# Patient Record
Sex: Female | Born: 1940 | Race: Black or African American | Hispanic: No | Marital: Single | Smoking: Former smoker
Health system: Southern US, Community
[De-identification: ages and names within clinical notes are randomized; demographics above are authoritative.]

## PROBLEM LIST (undated history)

## (undated) ENCOUNTER — Emergency Department (HOSPITAL_COMMUNITY): Admission: EM | Payer: Medicare Other | Source: Home / Self Care

## (undated) DIAGNOSIS — E039 Hypothyroidism, unspecified: Secondary | ICD-10-CM

## (undated) DIAGNOSIS — IMO0002 Reserved for concepts with insufficient information to code with codable children: Secondary | ICD-10-CM

## (undated) DIAGNOSIS — I10 Essential (primary) hypertension: Secondary | ICD-10-CM

## (undated) DIAGNOSIS — K219 Gastro-esophageal reflux disease without esophagitis: Secondary | ICD-10-CM

## (undated) DIAGNOSIS — M1990 Unspecified osteoarthritis, unspecified site: Secondary | ICD-10-CM

## (undated) DIAGNOSIS — E7800 Pure hypercholesterolemia, unspecified: Secondary | ICD-10-CM

## (undated) DIAGNOSIS — I2510 Atherosclerotic heart disease of native coronary artery without angina pectoris: Secondary | ICD-10-CM

## (undated) HISTORY — PX: OTHER SURGICAL HISTORY: SHX169

## (undated) HISTORY — PX: TONSILLECTOMY: SUR1361

## (undated) HISTORY — DX: Gastro-esophageal reflux disease without esophagitis: K21.9

## (undated) HISTORY — PX: EYE SURGERY: SHX253

## (undated) HISTORY — DX: Unspecified osteoarthritis, unspecified site: M19.90

## (undated) HISTORY — DX: Essential (primary) hypertension: I10

## (undated) HISTORY — DX: Pure hypercholesterolemia, unspecified: E78.00

## (undated) HISTORY — DX: Hypothyroidism, unspecified: E03.9

## (undated) HISTORY — DX: Reserved for concepts with insufficient information to code with codable children: IMO0002

## (undated) HISTORY — PX: ABDOMINAL HYSTERECTOMY: SHX81

---

## 2005-04-27 HISTORY — PX: ESOPHAGOGASTRODUODENOSCOPY: SHX1529

## 2008-04-27 HISTORY — PX: COLONOSCOPY: SHX174

## 2010-01-01 ENCOUNTER — Emergency Department (HOSPITAL_COMMUNITY): Admission: EM | Admit: 2010-01-01 | Discharge: 2010-01-01 | Payer: Self-pay | Admitting: Emergency Medicine

## 2010-07-10 LAB — CBC
HCT: 36.5 % (ref 36.0–46.0)
MCV: 82.8 fL (ref 78.0–100.0)
RDW: 18.4 % — ABNORMAL HIGH (ref 11.5–15.5)
WBC: 10.1 10*3/uL (ref 4.0–10.5)

## 2010-07-10 LAB — COMPREHENSIVE METABOLIC PANEL
Alkaline Phosphatase: 111 U/L (ref 39–117)
BUN: 20 mg/dL (ref 6–23)
GFR calc non Af Amer: 37 mL/min — ABNORMAL LOW (ref >60)
Glucose, Bld: 114 mg/dL — ABNORMAL HIGH (ref 70–99)
Potassium: 4.3 mEq/L (ref 3.5–5.1)
Total Bilirubin: 0.3 mg/dL (ref 0.3–1.2)
Total Protein: 8.1 g/dL (ref 6.0–8.3)

## 2010-07-10 LAB — DIFFERENTIAL
Basophils Absolute: 0.1 10*3/uL (ref 0.0–0.1)
Basophils Relative: 1 % (ref 0–1)
Monocytes Relative: 10 % (ref 3–12)
Neutro Abs: 4.7 10*3/uL (ref 1.7–7.7)
Neutrophils Relative %: 46 % (ref 43–77)

## 2010-07-10 LAB — URINALYSIS, ROUTINE W REFLEX MICROSCOPIC
Leukocytes, UA: NEGATIVE
Protein, ur: NEGATIVE mg/dL
Urobilinogen, UA: 0.2 mg/dL (ref 0.0–1.0)

## 2010-07-10 LAB — URINE MICROSCOPIC-ADD ON

## 2010-07-10 LAB — POCT CARDIAC MARKERS
CKMB, poc: 1.5 ng/mL (ref 1.0–8.0)
Myoglobin, poc: 153 ng/mL (ref 12–200)

## 2010-09-10 ENCOUNTER — Ambulatory Visit: Payer: Self-pay | Admitting: Otolaryngology

## 2010-11-29 ENCOUNTER — Emergency Department (HOSPITAL_COMMUNITY): Payer: Medicare Other

## 2010-11-29 ENCOUNTER — Emergency Department (HOSPITAL_COMMUNITY)
Admission: EM | Admit: 2010-11-29 | Discharge: 2010-11-29 | Disposition: A | Discharge status: Home / Self Care | Payer: Medicare Other | Attending: Emergency Medicine | Admitting: Emergency Medicine

## 2010-11-29 DIAGNOSIS — R5383 Other fatigue: Secondary | ICD-10-CM | POA: Insufficient documentation

## 2010-11-29 DIAGNOSIS — I10 Essential (primary) hypertension: Secondary | ICD-10-CM | POA: Insufficient documentation

## 2010-11-29 DIAGNOSIS — E039 Hypothyroidism, unspecified: Secondary | ICD-10-CM | POA: Insufficient documentation

## 2010-11-29 DIAGNOSIS — R5381 Other malaise: Secondary | ICD-10-CM | POA: Insufficient documentation

## 2010-11-29 DIAGNOSIS — K219 Gastro-esophageal reflux disease without esophagitis: Secondary | ICD-10-CM | POA: Insufficient documentation

## 2010-11-29 DIAGNOSIS — Z79899 Other long term (current) drug therapy: Secondary | ICD-10-CM | POA: Insufficient documentation

## 2010-11-29 LAB — COMPREHENSIVE METABOLIC PANEL
ALT: 22 U/L (ref 0–35)
CO2: 17 mEq/L — ABNORMAL LOW (ref 19–32)
Calcium: 9.5 mg/dL (ref 8.4–10.5)
Chloride: 109 mEq/L (ref 96–112)
GFR calc Af Amer: 41 mL/min — ABNORMAL LOW (ref >60)
GFR calc non Af Amer: 34 mL/min — ABNORMAL LOW (ref >60)
Glucose, Bld: 93 mg/dL (ref 70–99)
Sodium: 141 mEq/L (ref 135–145)
Total Bilirubin: 0.1 mg/dL — ABNORMAL LOW (ref 0.3–1.2)

## 2010-11-29 LAB — DIFFERENTIAL
Basophils Relative: 1 % (ref 0–1)
Lymphs Abs: 3.4 10*3/uL (ref 0.7–4.0)
Monocytes Relative: 10 % (ref 3–12)
Neutro Abs: 2 10*3/uL (ref 1.7–7.7)
Neutrophils Relative %: 33 % — ABNORMAL LOW (ref 43–77)

## 2010-11-29 LAB — CBC
Hemoglobin: 11.5 g/dL — ABNORMAL LOW (ref 12.0–15.0)
MCH: 27.3 pg (ref 26.0–34.0)
MCV: 82.5 fL (ref 78.0–100.0)
RBC: 4.22 MIL/uL (ref 3.87–5.11)
WBC: 6.2 10*3/uL (ref 4.0–10.5)

## 2010-11-29 LAB — URINALYSIS, ROUTINE W REFLEX MICROSCOPIC
Bilirubin Urine: NEGATIVE
Hgb urine dipstick: NEGATIVE
Protein, ur: NEGATIVE mg/dL
Urobilinogen, UA: 0.2 mg/dL (ref 0.0–1.0)

## 2010-11-29 LAB — URINE MICROSCOPIC-ADD ON

## 2010-12-02 LAB — URINE CULTURE: Colony Count: 55000

## 2011-11-30 ENCOUNTER — Other Ambulatory Visit: Payer: Self-pay | Admitting: Gastroenterology

## 2011-11-30 ENCOUNTER — Ambulatory Visit (INDEPENDENT_AMBULATORY_CARE_PROVIDER_SITE_OTHER): Payer: Medicare Other | Admitting: Gastroenterology

## 2011-11-30 ENCOUNTER — Encounter: Payer: Self-pay | Admitting: Gastroenterology

## 2011-11-30 VITALS — BP 126/76 | HR 56 | Temp 98.2°F | Ht 67.0 in | Wt 179.8 lb

## 2011-11-30 DIAGNOSIS — Z86010 Personal history of colonic polyps: Secondary | ICD-10-CM | POA: Insufficient documentation

## 2011-11-30 DIAGNOSIS — K5900 Constipation, unspecified: Secondary | ICD-10-CM

## 2011-11-30 MED ORDER — PEG 3350-KCL-NA BICARB-NACL 420 G PO SOLR: ORAL | Status: AC

## 2011-11-30 MED ORDER — LUBIPROSTONE 24 MCG PO CAPS: 24.0000 ug | ORAL_CAPSULE | Freq: Two times a day (BID) | ORAL | Status: AC

## 2011-11-30 NOTE — Patient Instructions (Addendum)
Start taking Amitiza , one daily WITH FOOD for three days. Then, increase to twice a day WITH FOOD. Watch for any diarrhea, loose stools. Make sure this is with food to avoid nausea.   Review the high fiber diet.  We have set you up for a colonoscopy with Dr. Darrick Penna in the near future.

## 2011-11-30 NOTE — Progress Notes (Signed)
Primary Care Physician:  Dr. Doreen Salvage Primary Gastroenterologist:  Dr. Darrick Penna  Chief Complaint  Patient presents with  . Colonoscopy    HPI:   Alexis Mckinney is a 71 year old female who presents today for surveillance colonoscopy due to hx of colon polyps. These were done in [place], and she has since moved to [place] and would like to establish care here. Reports last TCS in 2010 with multiple colonic polyps. I have requested the reports. Notes intermittent lower abdominal discomfort when needing to have a BM but relieved thereafter. Takes MOM, Miralax, stool softeners for constipation. Has taken Amitiza in past with good results. Not able to have a BM "without help". Epsom salts tried at one point.  Denies any rectal bleeding, N/V. +decreased appetite at times but no wt loss. Notes GERD controlled on Nexium, no dysphagia.     Past Medical History  Diagnosis Date  . Arthritis   . HTN (hypertension)   . High cholesterol   . Hypothyroidism   . GERD (gastroesophageal reflux disease)   . Ulcer     EGD 2007, states had bleeding ulcer, secondary to ASA and Plavix    Past Surgical History  Procedure Date  . Tonsillectomy   . Abdominal hysterectomy   . Eye surgery     lens implant left eye  . Colonoscopy [facility]  . Esophagogastroduodenoscopy 2007    Current Outpatient Prescriptions  Medication Sig Dispense Refill  . amLODipine-benazepril (LOTREL) 5-20 MG per capsule Take 1 capsule by mouth daily.      Marland Kitchen esomeprazole (NEXIUM) 40 MG capsule Take 40 mg by mouth daily before breakfast.      . levothyroxine (SYNTHROID, LEVOTHROID) 75 MCG tablet Take 75 mcg by mouth daily.      . metoprolol succinate (TOPROL-XL) 100 MG 24 hr tablet Take 100 mg by mouth daily. Take with or immediately following a meal.      . naproxen (NAPROSYN) 500 MG tablet Take 500 mg by mouth 2 (two) times daily with a meal.      . lubiprostone (AMITIZA) 24 MCG capsule Take 1  capsule (24 mcg total) by mouth 2 (two) times daily with a meal.  60 capsule  1  . polyethylene glycol-electrolytes (TRILYTE) 420 G solution Use as directed Also buy 1 fleet enema & 4 dulcolax tablets to use as directed  4000 mL  0    Allergies as of 11/30/2011  . (No Known Allergies)    Family History  Problem Relation Age of Onset  . Prostate cancer Brother   . Colon cancer Neg Hx     History   Social History  . Marital Status: Single    Spouse Name: N/A    Number of Children: N/A  . Years of Education: N/A   Occupational History  . Not on file.   Social History Main Topics  . Smoking status: Former Smoker -- 1.0 packs/day    Types: Cigarettes  . Smokeless tobacco: Former Neurosurgeon    Quit date: 04/28/1978  . Alcohol Use: No  . Drug Use: No  . Sexually Active: Not on file   Other Topics Concern  . Not on file   Social History Narrative  . No narrative on file    Review of Systems: Gen: Denies any fever, chills, fatigue, weight loss, lack of appetite.  CV: Denies chest pain, heart palpitations, peripheral edema, syncope.  Resp: Denies shortness of breath at rest or with exertion. Denies wheezing  or cough.  GI: Denies dysphagia or odynophagia. Denies jaundice, hematemesis, fecal incontinence. GU : Denies urinary burning, urinary frequency, urinary hesitancy MS: Denies joint pain, muscle weakness, cramps, or limitation of movement.  Derm: Denies rash, itching, dry skin Psych: Denies depression, anxiety, memory loss, and confusion Heme: Denies bruising, bleeding, and enlarged lymph nodes.  Physical Exam: BP 126/76  Pulse 56  Temp 98.2 F (36.8 C) (Temporal)  Ht 5\' 7"  (1.702 m)  Wt 179 lb 12.8 oz (81.557 kg)  BMI 28.16 kg/m2 General:   Alert and oriented. Pleasant and cooperative. Well-nourished and well-developed.  Head:  Normocephalic and atraumatic. Eyes:  Without icterus, sclera clear and conjunctiva pink.  Ears:  Normal auditory acuity. Nose:  No  deformity, discharge,  or lesions. Mouth:  No deformity or lesions, oral mucosa pink.  Neck:  Supple, without mass or thyromegaly. Lungs:  Clear to auscultation bilaterally. No wheezes, rales, or rhonchi. No distress.  Heart:  S1, S2 present without murmurs appreciated.  Abdomen:  +BS, soft, non-tender and non-distended. No HSM noted. No guarding or rebound. No masses appreciated.  Rectal:  Deferred  Msk:  Symmetrical without gross deformities. Normal posture. Extremities:  Without clubbing or edema. Neurologic:  Alert and  oriented x4;  grossly normal neurologically. Skin:  Intact without significant lesions or rashes. Cervical Nodes:  No significant cervical adenopathy. Psych:  Alert and cooperative. Normal mood and affect.

## 2011-11-30 NOTE — Progress Notes (Signed)
No PCP on file 

## 2011-11-30 NOTE — Progress Notes (Signed)
Faxed to Dr Roderic Palau office

## 2011-11-30 NOTE — Assessment & Plan Note (Signed)
Has tried multiple OTC agents without much relief. Amitiza has worked well in past; pt did not continue as she ran out of samples. Believes it was 24 mcg. Will trial Amitiza 24 mcg, high fiber diet. Samples provided and prescription sent to pharmacy.

## 2011-11-30 NOTE — Assessment & Plan Note (Signed)
71 year old with personal hx of multiple colon polyps in past, path unknown. Last TCS in 2010 by Dr. Elder Cyphers. Requesting records. Notes she was told to undergo surveillance in 3 years. No concerning symptoms at this time. Chronic constipation noted, no rectal bleeding.   Proceed with colonoscopy with Dr. Darrick Penna in the near future. The risks, benefits, and alternatives have been discussed in detail with the patient. They state understanding and desire to proceed.  Bowel regimen, see constipation Retrieve op notes, path for our records

## 2011-12-18 ENCOUNTER — Encounter (HOSPITAL_COMMUNITY): Payer: Self-pay | Admitting: Pharmacy Technician

## 2012-01-01 ENCOUNTER — Encounter (HOSPITAL_COMMUNITY): Payer: Self-pay | Admitting: *Deleted

## 2012-01-01 ENCOUNTER — Encounter (HOSPITAL_COMMUNITY)
Admission: RE | Disposition: A | Discharge status: Home / Self Care | Payer: Self-pay | Source: Ambulatory Visit | Attending: Gastroenterology

## 2012-01-01 ENCOUNTER — Ambulatory Visit (HOSPITAL_COMMUNITY)
Admission: RE | Admit: 2012-01-01 | Discharge: 2012-01-01 | Disposition: A | Discharge status: Home / Self Care | Payer: Medicare Other | Source: Ambulatory Visit | Attending: Gastroenterology | Admitting: Gastroenterology

## 2012-01-01 DIAGNOSIS — Z1211 Encounter for screening for malignant neoplasm of colon: Secondary | ICD-10-CM

## 2012-01-01 DIAGNOSIS — E7800 Pure hypercholesterolemia, unspecified: Secondary | ICD-10-CM | POA: Insufficient documentation

## 2012-01-01 DIAGNOSIS — Z860100 Personal history of colon polyps, unspecified: Secondary | ICD-10-CM | POA: Insufficient documentation

## 2012-01-01 DIAGNOSIS — K5730 Diverticulosis of large intestine without perforation or abscess without bleeding: Secondary | ICD-10-CM | POA: Insufficient documentation

## 2012-01-01 DIAGNOSIS — K648 Other hemorrhoids: Secondary | ICD-10-CM | POA: Insufficient documentation

## 2012-01-01 DIAGNOSIS — D126 Benign neoplasm of colon, unspecified: Secondary | ICD-10-CM

## 2012-01-01 DIAGNOSIS — D128 Benign neoplasm of rectum: Secondary | ICD-10-CM | POA: Insufficient documentation

## 2012-01-01 DIAGNOSIS — I10 Essential (primary) hypertension: Secondary | ICD-10-CM | POA: Insufficient documentation

## 2012-01-01 HISTORY — PX: COLONOSCOPY: SHX5424

## 2012-01-01 SURGERY — COLONOSCOPY
Anesthesia: Moderate Sedation

## 2012-01-01 MED ORDER — STERILE WATER FOR IRRIGATION IR SOLN
Status: DC | PRN
  Administered 2012-01-01: 09:00:00

## 2012-01-01 MED ORDER — SODIUM CHLORIDE 0.45 % IV SOLN
Freq: Once | INTRAVENOUS | Status: AC
  Administered 2012-01-01: 09:00:00 via INTRAVENOUS

## 2012-01-01 MED ORDER — MEPERIDINE HCL 100 MG/ML IJ SOLN
INTRAMUSCULAR | Status: DC | PRN
  Administered 2012-01-01 (×3): 25 mg via INTRAVENOUS

## 2012-01-01 MED ORDER — MEPERIDINE HCL 100 MG/ML IJ SOLN
INTRAMUSCULAR | Status: AC
  Filled 2012-01-01: qty 2

## 2012-01-01 MED ORDER — MIDAZOLAM HCL 5 MG/5ML IJ SOLN
INTRAMUSCULAR | Status: AC
  Filled 2012-01-01: qty 10

## 2012-01-01 MED ORDER — MIDAZOLAM HCL 5 MG/5ML IJ SOLN
INTRAMUSCULAR | Status: DC | PRN
  Administered 2012-01-01: 1 mg via INTRAVENOUS
  Administered 2012-01-01: 2 mg via INTRAVENOUS
  Administered 2012-01-01: 1 mg via INTRAVENOUS
  Administered 2012-01-01: 2 mg via INTRAVENOUS

## 2012-01-01 NOTE — Op Note (Addendum)
[facility] [address] [place], [address]   COLONOSCOPY PROCEDURE REPORT  PATIENT: Alexis Mckinney, Alexis Mckinney  MR#: [id number] BIRTHDATE: 1940-06-22 , 71  yrs. old GENDER: Female ENDOSCOPIST: Jonette Eva, MD REFERRED BY:  Reynolds Bowl, M.D. PROCEDURE DATE:  01/01/2012 PROCEDURE:   Colonoscopy with biopsy INDICATIONS:patient's personal history of colon polyps. MEDICATIONS: Demerol 75 mg IV and Versed 6 mg IV  DESCRIPTION OF PROCEDURE:    Physical exam was performed.  Informed consent was obtained from the patient after explaining the benefits, risks, and alternatives to procedure.  The patient was connected to monitor and placed in left lateral position. Continuous oxygen was provided by nasal cannula and IV medicine administered through an indwelling cannula.  After administration of sedation and rectal exam, the patients rectum was intubated and the Pentax Colonoscope [id number]  colonoscope was advanced under direct visualization to the cecum.  The scope was removed slowly by carefully examining the color, texture, anatomy, and integrity mucosa on the way out.  The patient was recovered in endoscopy and discharged home in satisfactory condition.       COLON FINDINGS: Moderate diverticulosis was noted in the descending colon and sigmoid colon.  , Two sessile polyps measuring 3-4 mm in size were found in the sigmoid colon & removed via cold forceps.  , and Moderate sized internal hemorrhoids were found.  PREP QUALITY: good. CECAL W/D TIME: 15 minutes  COMPLICATIONS: None  ENDOSCOPIC IMPRESSION: 1.   Moderate diverticulosis was noted in the descending colon and sigmoid colon 2.   Two sessile polyps measuring 3-4 mm in size were found in the sigmoid colon; multiple biopsies were performed using cold forceps 3.   Moderate sized internal hemorrhoids   RECOMMENDATIONS: 1.  High fiber diet 2.  repeat Colonoscopy in 5  years.       _______________________________ Rosalie DoctorJonette Eva, MD 01/01/2012 11:24 AM Revised: 01/01/2012 11:24 AM

## 2012-01-01 NOTE — H&P (Signed)
  Primary Care Physician:  Reynolds Bowl, MD Primary Gastroenterologist:  Dr. Darrick Penna  Pre-Procedure History & Physical: HPI:  Alexis Mckinney is a 71 y.o. female here for  PERSONAL HISTORY OF POLYPS.   Past Medical History  Diagnosis Date  . Arthritis   . HTN (hypertension)   . High cholesterol   . Hypothyroidism   . GERD (gastroesophageal reflux disease)   . Ulcer     EGD 2007, states had bleeding ulcer, secondary to ASA and Plavix    Past Surgical History  Procedure Date  . Tonsillectomy   . Abdominal hysterectomy   . Eye surgery     lens implant left eye  . Colonoscopy [facility]  . Esophagogastroduodenoscopy 2007    Prior to Admission medications   Medication Sig Start Date End Date Taking? Authorizing Provider  amLODipine-benazepril (LOTREL) 5-20 MG per capsule Take 1 capsule by mouth daily.   Yes Historical Provider, MD  esomeprazole (NEXIUM) 40 MG capsule Take 40 mg by mouth daily before breakfast.   Yes Historical Provider, MD  levothyroxine (SYNTHROID, LEVOTHROID) 75 MCG tablet Take 75 mcg by mouth daily.   Yes Historical Provider, MD  metoprolol succinate (TOPROL-XL) 100 MG 24 hr tablet Take 100 mg by mouth daily. Take with or immediately following a meal.   Yes Historical Provider, MD  naproxen (NAPROSYN) 500 MG tablet Take 500 mg by mouth 2 (two) times daily with a meal.    Historical Provider, MD    Allergies as of 11/30/2011  . (Not on File)    Family History  Problem Relation Age of Onset  . Prostate cancer Brother   . Colon cancer Neg Hx     History   Social History  . Marital Status: Single    Spouse Name: N/A    Number of Children: N/A  . Years of Education: N/A   Occupational History  . Not on file.   Social History Main Topics  . Smoking status: Former Smoker -- 1.0 packs/day    Types: Cigarettes  . Smokeless tobacco: Former Neurosurgeon    Quit date: 04/28/1978  . Alcohol Use: No  . Drug Use: No  . Sexually Active:  Not on file   Other Topics Concern  . Not on file   Social History Narrative  . No narrative on file    Review of Systems: See HPI, otherwise negative ROS   Physical Exam: BP 149/86  Temp 98 F (36.7 C) (Oral)  Resp 22  Ht 5\' 7"  (1.702 m)  Wt 179 lb (81.194 kg)  BMI 28.04 kg/m2 General:   Alert,  pleasant and cooperative in NAD Head:  Normocephalic and atraumatic. Neck:  Supple;  Lungs:  Clear throughout to auscultation.    Heart:  Regular rate and rhythm. Abdomen:  Soft, nontender and nondistended. Normal bowel sounds, without guarding, and without rebound.   Neurologic:  Alert and  oriented x4;  grossly normal neurologically.  Impression/Plan:     PERSONAL HISTORY OF POLYPS.  PLAN: 1. TCS TODAY

## 2012-01-05 ENCOUNTER — Encounter (HOSPITAL_COMMUNITY): Payer: Self-pay | Admitting: Gastroenterology

## 2012-01-05 ENCOUNTER — Telehealth: Payer: Self-pay | Admitting: Gastroenterology

## 2012-01-05 NOTE — Telephone Encounter (Signed)
Recall made 

## 2012-01-05 NOTE — Telephone Encounter (Signed)
Please call pt. She had HYPERPLASTIC POLYPS removed from her colon. FOLLOW A HIGH FIBER DIET. BECAUSE SHE HAS A HISTORY OF MULTIPLE COLON POLYPS WOULD REPEAT A COLONOSCOPY IN 5 YEARS.

## 2012-01-05 NOTE — Telephone Encounter (Signed)
Called and informed pt.  

## 2012-01-28 NOTE — Progress Notes (Signed)
TCS SEP 2-13 HYPERPLASTIC POLYPS  REVIEWED.

## 2015-07-09 ENCOUNTER — Other Ambulatory Visit (HOSPITAL_COMMUNITY): Payer: Self-pay | Admitting: Physician Assistant

## 2015-07-09 DIAGNOSIS — R1310 Dysphagia, unspecified: Secondary | ICD-10-CM

## 2015-07-15 ENCOUNTER — Ambulatory Visit (HOSPITAL_COMMUNITY)
Admission: RE | Admit: 2015-07-15 | Discharge: 2015-07-15 | Disposition: A | Discharge status: Home / Self Care | Payer: Medicare Other | Source: Ambulatory Visit | Attending: Physician Assistant | Admitting: Physician Assistant

## 2015-07-15 ENCOUNTER — Ambulatory Visit (HOSPITAL_COMMUNITY): Payer: Medicare Other | Attending: Physician Assistant | Admitting: Speech Pathology

## 2015-07-15 ENCOUNTER — Encounter (HOSPITAL_COMMUNITY): Payer: Self-pay | Admitting: Speech Pathology

## 2015-07-15 DIAGNOSIS — R1310 Dysphagia, unspecified: Secondary | ICD-10-CM | POA: Diagnosis not present

## 2015-07-15 DIAGNOSIS — E7800 Pure hypercholesterolemia, unspecified: Secondary | ICD-10-CM | POA: Diagnosis not present

## 2015-07-15 DIAGNOSIS — Z86010 Personal history of colonic polyps: Secondary | ICD-10-CM | POA: Insufficient documentation

## 2015-07-15 DIAGNOSIS — E039 Hypothyroidism, unspecified: Secondary | ICD-10-CM | POA: Insufficient documentation

## 2015-07-15 DIAGNOSIS — I10 Essential (primary) hypertension: Secondary | ICD-10-CM | POA: Diagnosis not present

## 2015-07-15 DIAGNOSIS — R1314 Dysphagia, pharyngoesophageal phase: Secondary | ICD-10-CM | POA: Insufficient documentation

## 2015-07-15 DIAGNOSIS — Z90710 Acquired absence of both cervix and uterus: Secondary | ICD-10-CM | POA: Diagnosis not present

## 2015-07-15 DIAGNOSIS — K219 Gastro-esophageal reflux disease without esophagitis: Secondary | ICD-10-CM | POA: Insufficient documentation

## 2015-07-15 NOTE — Therapy (Signed)
Alexis Mckinney, [place], [address] Phone: [phone]   Fax:  [phone]  Modified Barium Swallow  Patient Details  Name: Alexis Mckinney MRN: [id number] Date of Birth: [birth year] No Data Recorded  Encounter Date: 07/15/2015      End of Session - 07/15/15 2252    Visit Number 1   Number of Visits 1   Authorization Type Medicare   SLP Start Time 1300   SLP Stop Time  [id number]   SLP Time Calculation (min) 38 min   Activity Tolerance Patient tolerated treatment well      Past Medical History  Diagnosis Date  . Arthritis   . HTN (hypertension)   . High cholesterol   . Hypothyroidism   . GERD (gastroesophageal reflux disease)   . Ulcer     EGD 2007, states had bleeding ulcer, secondary to ASA and Plavix    Past Surgical History  Procedure Laterality Date  . Tonsillectomy    . Abdominal hysterectomy    . Eye surgery      lens implant left eye  . Colonoscopy  [facility]  . Esophagogastroduodenoscopy  2007  . Colonoscopy  01/01/2012    Procedure: COLONOSCOPY;  Surgeon: Danie Binder, MD;  Location: [facility];  Service: Endoscopy;  Laterality: N/A;  10:40    There were no vitals filed for this visit.  Visit Diagnosis: Dysphagia, pharyngoesophageal phase      Subjective Assessment - 07/15/15 2246    Subjective "I have trouble swallowing solid foods... especially meat and bread."   Special Tests mBSS   Currently in Pain? No/denies             General - 07/15/15 2246    General Information   Date of Onset 06/26/15   HPI Alexis Mckinney is a 75 year old woman who was referred for MBSS due to difficulty swallowing.   Type of Study MBS-Modified Barium Swallow Study   Diet Prior to this Study Dysphagia 3 (soft);Thin liquids  self regulated due to difficulty with solids   Temperature Spikes Noted No   Respiratory Status Room air   Behavior/Cognition Alert;Cooperative;Pleasant mood    Oral Cavity Assessment Within Functional Limits   Oral Care Completed by SLP No   Oral Cavity - Dentition --  missing some   Vision Functional for self feeding   Self-Feeding Abilities Able to feed self   Patient Positioning Upright in chair   Baseline Vocal Quality Normal   Volitional Cough Strong   Volitional Swallow Able to elicit   Anatomy Within functional limits   Pharyngeal Secretions Not observed secondary MBS            Oral Preparation/Oral Phase - 07/15/15 2249    Oral Preparation/Oral Phase   Oral Phase Within functional limits   Electrical stimulation - Oral Phase   Was Electrical Stimulation Used No          Pharyngeal Phase - 07/15/15 2249    Pharyngeal Phase   Pharyngeal Phase Within functional limits  flash penetration (underepiglottic coating during straw sips thins)   Electrical Stimulation - Pharyngeal Phase   Was Electrical Stimulation Used No          Cricopharyngeal Phase - 07/15/15 2249    Cervical Esophageal Phase   Cervical Esophageal Phase Within functional limits  delayed empyting in distal esophagus           Plan - 07/15/15  August 11, 2251    Clinical Impression Statement Oropharyngeal swallow is WNL for age demonstrating flash penetration of thin liquids during the swallow (underepiglottic coating only) with cup and straw sips which is considered normal age variant. 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 Pt described globus sensation in pharynx when tablet noted near GE junction and pharynx/larynx clear. Barium tablet remained in distal esophagus for the duration of the study. Recommend GI consult for possible EGD with dilation. Pt states she has an appointment with ENT (Dr. Benjamine Mola), however SLP recommends GI consult instead.          G-Codes - 2015/07/29 2251-08-11    Functional Assessment Tool Used clinical  judgment; MBSS   Functional Limitations Swallowing   Swallow Current Status [id number]) At least 1 percent but less than 20 percent impaired, limited or restricted   Swallow Goal Status [id number]) 0 percent impaired, limited or restricted   Swallow Discharge Status [phone]) At least 1 percent but less than 20 percent impaired, limited or restricted          Recommendations/Treatment - 29-Jul-2015 2250    Swallow Evaluation Recommendations   Recommended Consults Consider GI evaluation;Consider esophageal assessment   SLP Diet Recommendations Dysphagia 3 (mechanical soft);Thin   Liquid Administration via Cup;Straw   Medication Administration Whole meds with liquid  break large pills in half if able   Supervision Patient able to self feed   Compensations Follow solids with liquid   Postural Changes Seated upright at 90 degrees;Remain upright for at least 30 minutes after feeds/meals          Prognosis - Jul 29, 2015 10-Aug-2249    Prognosis   Prognosis for Safe Diet Advancement Good   Barriers/Prognosis Comment needs GI consult for possible EGD with dilation   Individuals Consulted   Consulted and Agree with Results and Recommendations Patient   Report Sent to  Referring physician      Problem List Patient Active Problem List   Diagnosis Date Noted  . Hx of colonic polyps 11/30/2011  . Constipation 11/30/2011   Thank you,  Genene Churn, [place]  [facility] 07-29-15, 10:55 PM  [place] [address] [place], [place], [address] Phone: [phone]   Fax:  [phone]  Name: Alexis Mckinney MRN: [id number] Date of Birth: Mar 08, 1941

## 2015-08-15 ENCOUNTER — Ambulatory Visit (INDEPENDENT_AMBULATORY_CARE_PROVIDER_SITE_OTHER): Payer: Medicare Other | Admitting: Otolaryngology

## 2015-09-09 ENCOUNTER — Encounter: Payer: Self-pay | Admitting: Gastroenterology

## 2015-09-27 ENCOUNTER — Ambulatory Visit (INDEPENDENT_AMBULATORY_CARE_PROVIDER_SITE_OTHER): Payer: Medicare Other | Admitting: Gastroenterology

## 2015-09-27 ENCOUNTER — Encounter: Payer: Self-pay | Admitting: Gastroenterology

## 2015-09-27 ENCOUNTER — Other Ambulatory Visit: Payer: Self-pay

## 2015-09-27 VITALS — BP 154/81 | HR 60 | Temp 98.3°F | Ht 67.0 in | Wt 191.2 lb

## 2015-09-27 DIAGNOSIS — R1310 Dysphagia, unspecified: Secondary | ICD-10-CM | POA: Diagnosis not present

## 2015-09-27 NOTE — Patient Instructions (Signed)
We have scheduled you for an upper endoscopy with dilation in the near future with Dr. Oneida Alar.   Avoid Ibuprofen if possible. Continue Nexium once daily.   We will see you in 3 months!  Your next colonoscopy will be in Sept 2018.

## 2015-09-27 NOTE — Progress Notes (Signed)
Primary Care Physician:  Marval Regal, MD Primary Gastroenterologist:  Dr. Oneida Alar   Chief Complaint  Patient presents with  . Dysphagia    HPI:   Alexis Mckinney is a 75 y.o. female presenting today at the request of her PCP secondary to dysphagia. Had MBSS by Speech in March 2017 noting oropharyngeal swallow within normal limits, with barium tablet remaining in distal esophagus during duration of study, raising concern for possible stricture at the GE junction. Last EGD in remote past, 2007.   Feels like food is hanging in her throat. Anything lumpy will "stop right there and not go down". Eating oatmeal the other morning and had problems. No nausea or vomiting. Just gets choked and has to spit up mucus. No lower GI symptoms, no rectal bleeding. Takes Aleve occasionally.   Past Medical History  Diagnosis Date  . Arthritis   . HTN (hypertension)   . High cholesterol   . Hypothyroidism   . GERD (gastroesophageal reflux disease)   . Ulcer     EGD 2007, states had bleeding ulcer, secondary to ASA and Plavix    Past Surgical History  Procedure Laterality Date  . Tonsillectomy    . Abdominal hysterectomy    . Eye surgery      lens implant left eye  . Colonoscopy  [facility]  . Esophagogastroduodenoscopy  2007  . Colonoscopy  01/01/2012    Dr. Oneida Alar: diverticulosis, sessile polyps (hyperplastic) due for surveillance in 2018 due to history of multiple colonic polyps   . Cardiac cath with stents      [facility]     Current Outpatient Prescriptions  Medication Sig Dispense Refill  . amLODipine-benazepril (LOTREL) 5-20 MG per capsule Take 1 capsule by mouth daily.    Marland Kitchen atorvastatin (LIPITOR) 80 MG tablet Take 80 mg by mouth daily.    . clopidogrel (PLAVIX) 75 MG tablet Take 75 mg by mouth daily.    Marland Kitchen esomeprazole (NEXIUM) 40 MG capsule Take 40 mg by mouth daily before breakfast.    . levothyroxine (SYNTHROID, LEVOTHROID) 75 MCG tablet Take 75 mcg by  mouth daily.    . metoprolol succinate (TOPROL-XL) 50 MG 24 hr tablet Take 50 mg by mouth daily. Take with or immediately following a meal.     No current facility-administered medications for this visit.    Allergies as of 09/27/2015 - Review Complete 09/27/2015  Allergen Reaction Noted  . Watermelon concentrate [citrullus vulgaris] Hypertension 01/01/2012    Family History  Problem Relation Age of Onset  . Prostate cancer Brother   . Colon cancer Neg Hx     Social History   Social History  . Marital Status: Single    Spouse Name: N/A  . Number of Children: N/A  . Years of Education: N/A   Occupational History  . Not on file.   Social History Main Topics  . Smoking status: Former Smoker -- 1.00 packs/day    Types: Cigarettes  . Smokeless tobacco: Former Systems developer    Quit date: 04/28/1978  . Alcohol Use: No  . Drug Use: No  . Sexual Activity: Not on file   Other Topics Concern  . Not on file   Social History Narrative    Review of Systems: Gen: Denies any fever, chills, fatigue, weight loss, lack of appetite.  CV: Denies chest pain, heart palpitations, peripheral edema, syncope.  Resp: Denies shortness of breath at rest or with exertion. Denies wheezing or cough.  GI: see HPI  GU : Denies urinary burning, urinary frequency, urinary hesitancy MS: +joint pain  Derm: Denies rash, itching, dry skin Psych: Denies depression, anxiety, memory loss, and confusion Heme: Denies bruising, bleeding, and enlarged lymph nodes.  Physical Exam: BP 154/81 mmHg  Pulse 60  Temp(Src) 98.3 F (36.8 C) (Oral)  Ht 5\' 7"  (1.702 m)  Wt 191 lb 3.2 oz (86.728 kg)  BMI 29.94 kg/m2 General:   Alert and oriented. Pleasant and cooperative. Well-nourished and well-developed.  Head:  Normocephalic and atraumatic. Eyes:  Without icterus, sclera clear and conjunctiva pink.  Ears:  Normal auditory acuity. Nose:  No deformity, discharge,  or lesions. Mouth:  No deformity or lesions, oral  mucosa pink.  Lungs:  Clear to auscultation bilaterally. No wheezes, rales, or rhonchi. No distress.  Heart:  S1, S2 present without murmurs appreciated.  Abdomen:  +BS, soft, non-tender and non-distended. No HSM noted. No guarding or rebound. No masses appreciated.  Rectal:  Deferred  Msk:  Symmetrical without gross deformities.  Extremities:  Without  edema. Neurologic:  Alert and  oriented x4 Skin:  Intact without significant lesions or rashes. Psych:  Alert and cooperative. Normal mood and affect.

## 2015-09-27 NOTE — Assessment & Plan Note (Addendum)
75 year old female with dysphagia, evaluated by speech in March 2017 with MBSS and found to likely have an esophageal component and concern for stricture. Symptomatic with dysphagia noted to all textures. Last EGD in remote past per patient.   Proceed with upper endoscopy in the near future with Dr. Oneida Alar. The risks, benefits, and alternatives have been discussed in detail with patient. They have stated understanding and desire to proceed.  Continue Nexium once per day. As of note, patient is on Plavix Next colonoscopy due in Sept 2018 if health permits due to history of multiple polyps in past  Return in 3 months for reassessment after EGD

## 2015-09-30 ENCOUNTER — Encounter (HOSPITAL_COMMUNITY)
Admission: RE | Disposition: A | Discharge status: Home / Self Care | Payer: Self-pay | Source: Ambulatory Visit | Attending: Gastroenterology

## 2015-09-30 ENCOUNTER — Encounter (HOSPITAL_COMMUNITY): Payer: Self-pay | Admitting: *Deleted

## 2015-09-30 ENCOUNTER — Telehealth: Payer: Self-pay | Admitting: Gastroenterology

## 2015-09-30 ENCOUNTER — Ambulatory Visit (HOSPITAL_COMMUNITY)
Admission: RE | Admit: 2015-09-30 | Discharge: 2015-09-30 | Disposition: A | Discharge status: Home / Self Care | Payer: Medicare Other | Source: Ambulatory Visit | Attending: Gastroenterology | Admitting: Gastroenterology

## 2015-09-30 DIAGNOSIS — Z7901 Long term (current) use of anticoagulants: Secondary | ICD-10-CM | POA: Diagnosis not present

## 2015-09-30 DIAGNOSIS — Z79899 Other long term (current) drug therapy: Secondary | ICD-10-CM | POA: Diagnosis not present

## 2015-09-30 DIAGNOSIS — K449 Diaphragmatic hernia without obstruction or gangrene: Secondary | ICD-10-CM | POA: Insufficient documentation

## 2015-09-30 DIAGNOSIS — K219 Gastro-esophageal reflux disease without esophagitis: Secondary | ICD-10-CM | POA: Insufficient documentation

## 2015-09-30 DIAGNOSIS — K269 Duodenal ulcer, unspecified as acute or chronic, without hemorrhage or perforation: Secondary | ICD-10-CM | POA: Diagnosis not present

## 2015-09-30 DIAGNOSIS — E7800 Pure hypercholesterolemia, unspecified: Secondary | ICD-10-CM | POA: Insufficient documentation

## 2015-09-30 DIAGNOSIS — R1310 Dysphagia, unspecified: Secondary | ICD-10-CM | POA: Diagnosis not present

## 2015-09-30 DIAGNOSIS — I2510 Atherosclerotic heart disease of native coronary artery without angina pectoris: Secondary | ICD-10-CM | POA: Insufficient documentation

## 2015-09-30 DIAGNOSIS — K222 Esophageal obstruction: Secondary | ICD-10-CM | POA: Diagnosis not present

## 2015-09-30 DIAGNOSIS — E039 Hypothyroidism, unspecified: Secondary | ICD-10-CM | POA: Insufficient documentation

## 2015-09-30 DIAGNOSIS — Z8711 Personal history of peptic ulcer disease: Secondary | ICD-10-CM | POA: Diagnosis not present

## 2015-09-30 DIAGNOSIS — I10 Essential (primary) hypertension: Secondary | ICD-10-CM | POA: Diagnosis not present

## 2015-09-30 DIAGNOSIS — K2950 Unspecified chronic gastritis without bleeding: Secondary | ICD-10-CM | POA: Insufficient documentation

## 2015-09-30 DIAGNOSIS — K254 Chronic or unspecified gastric ulcer with hemorrhage: Secondary | ICD-10-CM

## 2015-09-30 DIAGNOSIS — Z87891 Personal history of nicotine dependence: Secondary | ICD-10-CM | POA: Insufficient documentation

## 2015-09-30 HISTORY — PX: SAVORY DILATION: SHX5439

## 2015-09-30 HISTORY — DX: Atherosclerotic heart disease of native coronary artery without angina pectoris: I25.10

## 2015-09-30 HISTORY — PX: ESOPHAGOGASTRODUODENOSCOPY: SHX5428

## 2015-09-30 SURGERY — EGD (ESOPHAGOGASTRODUODENOSCOPY)
Anesthesia: Moderate Sedation

## 2015-09-30 MED ORDER — LIDOCAINE VISCOUS 2 % MT SOLN
OROMUCOSAL | Status: AC
  Filled 2015-09-30: qty 15

## 2015-09-30 MED ORDER — MEPERIDINE HCL 100 MG/ML IJ SOLN
INTRAMUSCULAR | Status: AC
  Filled 2015-09-30: qty 2

## 2015-09-30 MED ORDER — SODIUM CHLORIDE 0.9 % IJ SOLN
PREFILLED_SYRINGE | INTRAMUSCULAR | Status: DC | PRN
  Administered 2015-09-30: 3 mL

## 2015-09-30 MED ORDER — SIMETHICONE 40 MG/0.6ML PO SUSP
ORAL | Status: DC | PRN
  Administered 2015-09-30: 15:00:00

## 2015-09-30 MED ORDER — MINERAL OIL PO OIL
TOPICAL_OIL | ORAL | Status: AC
  Filled 2015-09-30: qty 30

## 2015-09-30 MED ORDER — ESOMEPRAZOLE MAGNESIUM 40 MG PO CPDR: 40.0000 mg | DELAYED_RELEASE_CAPSULE | Freq: Two times a day (BID) | ORAL | Status: DC

## 2015-09-30 MED ORDER — EPINEPHRINE HCL 0.1 MG/ML IJ SOSY
PREFILLED_SYRINGE | INTRAMUSCULAR | Status: AC
  Filled 2015-09-30: qty 10

## 2015-09-30 MED ORDER — MIDAZOLAM HCL 5 MG/5ML IJ SOLN
INTRAMUSCULAR | Status: DC | PRN
  Administered 2015-09-30 (×2): 2 mg via INTRAVENOUS

## 2015-09-30 MED ORDER — MIDAZOLAM HCL 5 MG/5ML IJ SOLN
INTRAMUSCULAR | Status: AC
  Filled 2015-09-30: qty 10

## 2015-09-30 MED ORDER — LIDOCAINE VISCOUS 2 % MT SOLN
OROMUCOSAL | Status: DC | PRN
  Administered 2015-09-30: 1 via OROMUCOSAL

## 2015-09-30 MED ORDER — MEPERIDINE HCL 100 MG/ML IJ SOLN
INTRAMUSCULAR | Status: DC | PRN
  Administered 2015-09-30: 50 mg via INTRAVENOUS
  Administered 2015-09-30: 25 mg via INTRAVENOUS

## 2015-09-30 MED ORDER — SODIUM CHLORIDE 0.9 % IV SOLN
INTRAVENOUS | Status: DC
Start: 2015-09-30 — End: 2015-09-30
  Administered 2015-09-30: 1000 mL via INTRAVENOUS

## 2015-09-30 NOTE — Discharge Instructions (Signed)
I dilated your esophagus. You have a stricture near the base of your esophagus.  You have A LARGE HIATAL HERNIA, EROSIVE gastritis/DUODENITIS & I CAUTERIZED AN ULCER. I biopsied your stomach.   FOLLOW A SOFT MECHANICAL DIET.  MEATS SHOULD BE CHOPPED OR GROUND ONLY. DO NOT EAT CHUNKS OF ANYTHING. SEE INFO BELOW.  CONTINUE PLAVIX.  INCREASE NEXIUM. TAKE 30 MINUTES BEFORE MEALS TWICE DAILY.  AVOID ASPIRIN, BC/GOODY POWDERS, IBUPROFEN/MOTRIN, OR NAPROXEN/ALEVE FOR 4 WEEKS.  YOUR BIOPSY RESULTS WILL BE AVAILABLE IN MY CHART JUN 8 AND MY OFFICE WILL CONTACT YOU IN 10-14 DAYS WITH YOUR RESULTS.    FOLLOW UP IN 3-4 WEEKS FOR REPEAT UPPER ENDOSCOPY TO COMPLETE STRETCHING YOUR ESOPHAGUS.  REPEAT UPPER ENDOSCOPY TO IN 3 MOS TO MAKE SURE YOUR ULCERS HAS HEALED.    UPPER ENDOSCOPY AFTER CARE Read the instructions outlined below and refer to this sheet in the next week. These discharge instructions provide you with general information on caring for yourself after you leave the hospital. While your treatment has been planned according to the most current medical practices available, unavoidable complications occasionally occur. If you have any problems or questions after discharge, call DR. Delorean Knutzen, [phone].  ACTIVITY  You may resume your regular activity, but move at a slower pace for the next 24 hours.   Take frequent rest periods for the next 24 hours.   Walking will help get rid of the air and reduce the bloated feeling in your belly (abdomen).   No driving for 24 hours (because of the medicine (anesthesia) used during the test).   You may shower.   Do not sign any important legal documents or operate any machinery for 24 hours (because of the anesthesia used during the test).    NUTRITION  Drink plenty of fluids.   You may resume your normal diet as instructed by your doctor.   Begin with a light meal and progress to your normal diet. Heavy or fried foods are harder to digest  and may make you feel sick to your stomach (nauseated).   Avoid alcoholic beverages for 24 hours or as instructed.    MEDICATIONS  You may resume your normal medications.   WHAT YOU CAN EXPECT TODAY  Some feelings of bloating in the abdomen.   Passage of more gas than usual.    IF YOU HAD A BIOPSY TAKEN DURING THE UPPER ENDOSCOPY:  Eat a soft diet IF YOU HAVE NAUSEA, BLOATING, ABDOMINAL PAIN, OR VOMITING.    FINDING OUT THE RESULTS OF YOUR TEST Not all test results are available during your visit. DR. Oneida Alar WILL CALL YOU WITHIN 14 DAYS OF YOUR PROCEDUE WITH YOUR RESULTS. Do not assume everything is normal if you have not heard from DR. Takesha Steger, CALL HER OFFICE AT [phone].  SEEK IMMEDIATE MEDICAL ATTENTION AND CALL THE OFFICE: [phone] IF:  You have more than a spotting of blood in your stool.   Your belly is swollen (abdominal distention).   You are nauseated or vomiting.   You have a temperature over 101F.   You have abdominal pain or discomfort that is severe or gets worse throughout the day.   Ulcer Disease (Peptic Ulcer, Gastric Ulcer, Duodenal Ulcer) You have an ulcer. This may be in your stomach (gastric ulcer) or in the first part of your small bowel, the duodenum (duodenal ulcer). An ulcer is a break in the lining of the stomach or duodenum. The ulcer causes erosion into the deeper tissue.  CAUSES The  stomach has a lining to protect itself from the acid that digests food. The lining can be damaged in two main ways:  The Helico Pylori bacteria (H. Pyolori) can infect the lining of the stomach and cause ulcers.   Nonsteroidal, anti-inflammatory medications (NSAIDS) can cause gastric ulcerations.   SYMPTOMS The problems (symptoms) of ulcer disease are usually a burning or gnawing of the mid-upper belly (abdomen). This is often worse on an empty stomach and may get better with food. This may be associated with feeling sick to your stomach (nausea),  bloating, and vomiting.  HOME CARE INSTRUCTIONS  Avoid foods that seem to aggravate or cause discomfort.   Special diets are not usually needed.     ESOPHAGEAL STRICTURE  Esophageal strictures can be caused by stomach acid backing up into the tube that carries food from the mouth down to the stomach (lower esophagus).  TREATMENT There are a number of medicines used to treat reflux/stricture, including: Antacids.  Proton-pump inhibitors: NEXIUM  HOME CARE INSTRUCTIONS Eat 2-3 hours before going to bed.  Try to reach and maintain a healthy weight.  Do not eat just a few very large meals. Instead, eat 4 TO 6 smaller meals throughout the day.  Try to identify foods and beverages that make your symptoms worse, and avoid these.  Avoid tight clothing.   SOFT MECHANICAL DIET This SOFT MECHANICAL DIET is restricted to:  Foods that are moist, soft-textured, and easy to chew and swallow.   Meats that are ground or are minced no larger than one-quarter inch pieces. Meats are moist with gravy or sauce added.   Foods that do not include bread or bread-like textures except soft pancakes, well-moistened with syrup or sauce.   Textures with some chewing ability required.   Casseroles without rice.   Cooked vegetables that are less than half an inch in size and easily mashed with a fork. No cooked corn, peas, broccoli, cauliflower, cabbage, Brussels sprouts, asparagus, or other fibrous, non-tender or rubbery cooked vegetables.   Canned fruit except for pineapple. Fruit must be cut into pieces no larger than half an inch in size.   Foods that do not include nuts, seeds, coconut, or sticky textures.   FOOD TEXTURES FOR DYSPHAGIA DIET LEVEL 2 -SOFT MECHANICAL DIET (includes all foods on Dysphagia Diet Level 1 - Pureed, in addition to the foods listed below)  FOOD GROUP: Breads. RECOMMENDED: Soft pancakes, well-moistened with syrup or sauce.  AVOID: All others.  FOOD GROUP:  Cereals.  RECOMMENDED: Cooked cereals with little texture, including oatmeal. Unprocessed wheat bran stirred into cereals for bulk. Note: If thin liquids are restricted, it is important that all of the liquid is absorbed into the cereal.  AVOID: All dry cereals and any cooked cereals that may contain flax seeds or other seeds or nuts. Whole-grain, dry, or coarse cereals. Cereals with nuts, seeds, dried fruit, and/or coconut.  FOOD GROUP: Desserts. RECOMMENDED: Pudding, custard. Soft fruit pies with bottom crust only. Canned fruit (excluding pineapple). Soft, moist cakes with icing.Frozen malts, milk shakes, frozen yogurt, eggnog, nutritional supplements, ice cream, sherbet, regular or sugar-free gelatin, or any foods that become thin liquid at either room (70 F) or body temperature (98 F).  AVOID: Dry, coarse cakes and cookies. Anything with nuts, seeds, coconut, pineapple, or dried fruit. Breakfast yogurt with nuts. Rice or bread pudding.  FOOD GROUP: Fats. RECOMMENDED: Butter, margarine, cream for cereal (depending on liquid consistency recommendations), gravy, cream sauces, sour cream, sour cream dips with  soft additives, mayonnaise, salad dressings, cream cheese, cream cheese spreads with soft additives, whipped toppings.  AVOID: All fats with coarse or chunky additives.  FOOD GROUP: Fruits. RECOMMENDED: Soft drained, canned, or cooked fruits without seeds or skin. Fresh soft and ripe banana. Fruit juices with a small amount of pulp. If thin liquids are restricted, fruit juices should be thickened to appropriate consistency.  AVOID: Fresh or frozen fruits. Cooked fruit with skin or seeds. Dried fruits. Fresh, canned, or cooked pineapple.  FOOD GROUP: Meats and Meat Substitutes. (Meat pieces should not exceed 1/4 of an inch cube and should be tender.) RECOMMENDED: Moistened ground or cooked meat, poultry, or fish. Moist ground or tender meat may be served with gravy or sauce. Casseroles  without rice. Moist macaroni and cheese, well-cooked pasta with meat sauce, tuna noodle casserole, soft, moist lasagna. Moist meatballs, meatloaf, or fish loaf. Protein salads, such as tuna or egg without large chunks, celery, or onion. Cottage cheese, smooth quiche without large chunks. Poached, scrambled, or soft-cooked eggs (egg yolks should not be runny but should be moist and able to be mashed with butter, margarine, or other moisture added to them). (Cook eggs to 160 F or use pasteurized eggs for safety.) Souffls may have small, soft chunks. Tofu. Well-cooked, slightly mashed, moist legumes, such as baked beans. All meats or protein substitutes should be served with sauces or moistened to help maintain cohesiveness in the oral cavity.  AVOID: Dry meats, tough meats (such as bacon, sausage, hot dogs, bratwurst). Dry casseroles or casseroles with rice or large chunks. Peanut butter. Cheese slices and cubes. Hard-cooked or crisp fried eggs. Sandwiches.Pizza.  FOOD GROUP: Potatoes and Starches. RECOMMENDED: Well-cooked, moistened, boiled, baked, or mashed potatoes. Well-cooked shredded hash brown potatoes that are not crisp. (All potatoes need to be moist and in sauces.)Well-cooked noodles in sauce. Spaetzel or soft dumplings that have been moistened with butter or gravy.  AVOID: Potato skins and chips. Fried or French-fried potatoes. Rice.  FOOD GROUP: Soups. RECOMMENDED: Soups with easy-to-chew or easy-to-swallow meats or vegetables: Particle sizes in soups should be less than 1/2 inch. Soups will need to be thickened to appropriate consistency if soup is thinner than prescribed liquid consistency.  AVOID: Soups with large chunks of meat and vegetables. Soups with rice, corn, peas.  FOOD GROUP: Vegetables. RECOMMENDED: All soft, well-cooked vegetables. Vegetables should be less than a half inch. Should be easily mashed with a fork.  AVOID: Cooked corn and peas. Broccoli, cabbage, Brussels  sprouts, asparagus, or other fibrous, non-tender or rubbery cooked vegetables.  FOOD GROUP: Miscellaneous. RECOMMENDED: Jams and preserves without seeds, jelly. Sauces, salsas, etc., that may have small tender chunks less than 1/2 inch. Soft, smooth chocolate bars that are easily chewed.  AVOID: Seeds, nuts, coconut, or sticky foods. Chewy candies such as caramels or licorice.

## 2015-09-30 NOTE — Op Note (Addendum)
[facility] Patient Name: Alexis Mckinney Procedure Date: 09/30/2015 3:26 PM MRN: [id number] Date of Birth: May 02, 1940 Attending MD: Barney Drain , MD CSN: [id number] Age: 75 Admit Type: Outpatient Procedure:                Upper GI endoscopy WITH BIOPSY, DILATION, &                            BICAP/EPINEPHRINE INJECTION Indications:              Dysphagia Providers:                Barney Drain, MD, Rosina Lowenstein, RN, Isabella Stalling,                            Technician Referring MD:             Manual Meier. Comstock, MD Medicines:                Meperidine 75 mg IV, Midazolam 4 mg IV Complications:            No immediate complications. Estimated Blood Loss:     Estimated blood loss was minimal. Procedure:                Pre-Anesthesia Assessment:                           - Prior to the procedure, a History and Physical                            was performed, and patient medications and                            allergies were reviewed. The patient's tolerance of                            previous anesthesia was also reviewed. The risks                            and benefits of the procedure and the sedation                            options and risks were discussed with the patient.                            All questions were answered, and informed consent                            was obtained. Prior Anticoagulants: The patient has                            taken Plavix (clopidogrel), last dose was day of                            procedure. ASA Grade Assessment: II - A patient  with mild systemic disease. After reviewing the                            risks and benefits, the patient was deemed in                            satisfactory condition to undergo the procedure.                           After obtaining informed consent, the endoscope was                            passed under direct vision. Throughout the                             procedure, the patient's blood pressure, pulse, and                            oxygen saturations were monitored continuously. The                            [id number]) scope was introduced through the                            mouth, and advanced to the second part of duodenum.                            The upper GI endoscopy was accomplished without                            difficulty. The patient tolerated the procedure                            well. Scope In: 3:36:18 PM Scope Out: 3:50:53 PM Total Procedure Duration: 0 hours 14 minutes 35 seconds  Findings:      One moderate benign-appearing, intrinsic stenosis was found. And was       traversed. A guidewire was placed and the scope was withdrawn. Dilation       was performed with a Savary dilator with mild resistance at 12 mm, 12.8       mm and 14 mm.      A large hiatal hernia was present.      One oozing cratered gastric ulcer with pigmented material was found in       the gastric antrum. Area was successfully injected with 3 mL of a       1:10,000 solution of epinephrine for hemostasis. Coagulation for       hemostasis using bipolar probe was successful.      Few non-bleeding superficial duodenal ulcers with no stigmata of       bleeding were found in the duodenal bulb.      The second portion of the duodenum was normal.      Scattered mild inflammation characterized by congestion (edema) and       erythema was found in the stomach. Biopsies were taken with a cold  forceps for Helicobacter pylori testing. Impression:               - Benign-appearing esophageal stenosis. Dilated.                           - Large hiatal hernia.                           - Oozing gastric ulcer with pigmented material.                            Injected. Treated with bipolar cautery.                           - Multiple non-bleeding duodenal ulcers with no                            stigmata of bleeding. Moderate Sedation:       Moderate (conscious) sedation was administered by the endoscopy nurse       and supervised by the endoscopist. The following parameters were       monitored: oxygen saturation, heart rate, blood pressure, and response       to care. Total physician intraservice time was 26 minutes. Recommendation:           - Resume previous diet.                           - Continue present medications.                           - Await pathology results.                           - Repeat upper endoscopy in 4 weeks for                            surveillance.                           FOLLOW A SOFT MECHANICAL DIET. MEATS SHOULD BE                            CHOPPED OR GROUND ONLY. DO NOT EAT CHUNKS OF                            ANYTHING. SEE INFO BELOW.                           CONTINUE PLAVIX.                           INCREASE NEXIUM. TAKE 30 MINUTES BEFORE MEALS TWICE                            DAILY.                           AVOID  ASPIRIN, BC/GOODY POWDERS, IBUPROFEN/MOTRIN,                            OR NAPROXEN/ALEVE FOR 4 WEEKS.                           FOLLOW UP IN 3-4 WEEKS FOR REPEAT UPPER ENDOSCOPY                            TO COMPLETE STRETCHING YOUR ESOPHAGUS.                           REPEAT UPPER ENDOSCOPY TO IN 3 MOS TO MAKE SURE                            YOUR ULCERS HAS HEALED.                           - Patient has a contact number available for                            emergencies. The signs and symptoms of potential                            delayed complications were discussed with the                            patient. Return to normal activities tomorrow.                            Written discharge instructions were provided to the                            patient. Procedure Code(s):        --- Professional ---                           [id number], 59, Esophagogastroduodenoscopy, flexible,                            transoral; with control of bleeding, any method                            43248, Esophagogastroduodenoscopy, flexible,                            transoral; with insertion of guide wire followed by                            passage of dilator(s) through esophagus over guide                            wire                           43239, Esophagogastroduodenoscopy, flexible,  transoral; with biopsy, single or multiple                           99153, Moderate sedation services; each additional                            15 minutes intraservice time                           G0500, Moderate sedation services provided by the                            same physician or other qualified health care                            professional performing a gastrointestinal                            endoscopic service that sedation supports,                            requiring the presence of an independent trained                            observer to assist in the monitoring of the                            patient's level of consciousness and physiological                            status; initial 15 minutes of intra-service time;                            patient age 68 years or older (additional time may                            be reported with [phone], as appropriate) Diagnosis Code(s):        --- Professional ---                           K22.2, Esophageal obstruction                           K44.9, Diaphragmatic hernia without obstruction or                            gangrene                           K25.4, Chronic or unspecified gastric ulcer with                            hemorrhage                           K26.9, Duodenal ulcer, unspecified as acute or  chronic, without hemorrhage or perforation                           R13.10, Dysphagia, unspecified CPT copyright 2016 [employer]. All rights reserved. The codes documented in this report are preliminary and  upon coder review may  be revised to meet current compliance requirements. Barney Drain, MD Barney Drain, MD 09/30/2015 5:30:38 PM This report has been signed electronically. Number of Addenda: 0

## 2015-09-30 NOTE — Progress Notes (Signed)
REVIEWED-NO ADDITIONAL RECOMMENDATIONS. 

## 2015-09-30 NOTE — Progress Notes (Signed)
CC'D O PCP

## 2015-09-30 NOTE — Telephone Encounter (Signed)
[name], RN from short stay called this afternoon to say that patient needed to be triage in 3-4 weeks for a repeat upper endoscopy and to follow up in 3 months to check her ulcers. Pt is already scheduled to come back in September. Please advise if I need to make her an OV in 3-4 weeks or if she can get scheduled again without coming in.

## 2015-09-30 NOTE — H&P (Signed)
Primary Care Physician:  Marval Regal, MD Primary Gastroenterologist:  Dr. Oneida Alar  Pre-Procedure History & Physical: HPI:  Alexis Mckinney is a 75 y.o. female here for DYSPHAGIA.  Past Medical History  Diagnosis Date  . Arthritis   . HTN (hypertension)   . High cholesterol   . Hypothyroidism   . GERD (gastroesophageal reflux disease)   . Ulcer     EGD 2007, states had bleeding ulcer, secondary to ASA and Plavix  . Coronary artery disease     Past Surgical History  Procedure Laterality Date  . Tonsillectomy    . Abdominal hysterectomy    . Eye surgery      lens implant left eye  . Colonoscopy  [facility]  . Esophagogastroduodenoscopy  2007  . Colonoscopy  01/01/2012    Dr. Oneida Alar: diverticulosis, sessile polyps (hyperplastic) due for surveillance in 2018 due to history of multiple colonic polyps   . Cardiac cath with stents      [facility]     Prior to Admission medications   Medication Sig Start Date End Date Taking? Authorizing Provider  amLODipine-benazepril (LOTREL) 5-20 MG per capsule Take 1 capsule by mouth daily.   Yes Historical Provider, MD  atorvastatin (LIPITOR) 80 MG tablet Take 80 mg by mouth daily.   Yes Historical Provider, MD  clopidogrel (PLAVIX) 75 MG tablet Take 75 mg by mouth daily.   Yes Historical Provider, MD  esomeprazole (NEXIUM) 40 MG capsule Take 40 mg by mouth daily before breakfast.   Yes Historical Provider, MD  levothyroxine (SYNTHROID, LEVOTHROID) 75 MCG tablet Take 75 mcg by mouth daily.   Yes Historical Provider, MD  metoprolol succinate (TOPROL-XL) 50 MG 24 hr tablet Take 50 mg by mouth daily. Take with or immediately following a meal.   Yes Historical Provider, MD    Allergies as of 09/27/2015 - Review Complete 09/27/2015  Allergen Reaction Noted  . Watermelon concentrate [citrullus vulgaris] Hypertension 01/01/2012    Family History  Problem Relation Age of Onset  . Prostate cancer Brother   . Colon cancer  Neg Hx   . Heart attack Father   . Aneurysm Sister   . Aneurysm Brother   . Lung disease Brother     Social History   Social History  . Marital Status: Single    Spouse Name: N/A  . Number of Children: N/A  . Years of Education: N/A   Occupational History  . Not on file.   Social History Main Topics  . Smoking status: Former Smoker -- 1.00 packs/day    Types: Cigarettes  . Smokeless tobacco: Former Systems developer    Quit date: 04/28/1978  . Alcohol Use: No  . Drug Use: No  . Sexual Activity: Not on file   Other Topics Concern  . Not on file   Social History Narrative    Review of Systems: See HPI, otherwise negative ROS   Physical Exam: BP 129/64 mmHg  Pulse 72  Temp(Src) 98.3 F (36.8 C) (Oral)  Resp 18  Ht 5\' 7"  (1.702 m)  Wt 192 lb (87.091 kg)  BMI 30.06 kg/m2  SpO2 98% General:   Alert,  pleasant and cooperative in NAD Head:  Normocephalic and atraumatic. Neck:  Supple; Lungs:  Clear throughout to auscultation.    Heart:  Regular rate and rhythm. Abdomen:  Soft, nontender and nondistended. Normal bowel sounds, without guarding, and without rebound.   Neurologic:  Alert and  oriented x4;  grossly normal  neurologically.  Impression/Plan:     DYSPHAGIA  PLAN:  EGD/DIL TODAY

## 2015-10-01 NOTE — Telephone Encounter (Signed)
Noted. Will triage per the note in 3-4 weeks.

## 2015-10-07 ENCOUNTER — Encounter (HOSPITAL_COMMUNITY): Payer: Self-pay | Admitting: Gastroenterology

## 2015-10-10 ENCOUNTER — Telehealth: Payer: Self-pay | Admitting: Gastroenterology

## 2015-10-10 NOTE — Telephone Encounter (Signed)
Pt is aware of OV in September but is confused with the instructions she was given. Does she also need OV in 3-4 weeks or can she wait until September? Please advise [phone]    FOLLOW UP IN 3-4 WEEKS FOR REPEAT UPPER ENDOSCOPY TO COMPLETE STRETCHING YOUR ESOPHAGUS.   REPEAT UPPER ENDOSCOPY TO IN 3 MOS TO MAKE SURE YOUR ULCERS HAS HEALED.

## 2015-10-10 NOTE — Telephone Encounter (Signed)
PT DOESN'T NEED AN OPV. TRIAGE FOR EGD/DIL IN 3-4 WEEKS AND TRIAGE FOR EGD IN SEP 2017.

## 2015-10-10 NOTE — Telephone Encounter (Signed)
Routing to [employer] for triage

## 2015-10-14 NOTE — Telephone Encounter (Signed)
I have cancelled the [place] in Sept.  Pt needs  to be triaged for both dates.

## 2015-10-16 ENCOUNTER — Telehealth: Payer: Self-pay

## 2015-10-17 ENCOUNTER — Other Ambulatory Visit: Payer: Self-pay

## 2015-10-17 DIAGNOSIS — R1310 Dysphagia, unspecified: Secondary | ICD-10-CM

## 2015-10-17 NOTE — Telephone Encounter (Signed)
REVIEWED-NO ADDITIONAL RECOMMENDATIONS. 

## 2015-10-17 NOTE — Telephone Encounter (Signed)
Reminder in epic °

## 2015-10-17 NOTE — Telephone Encounter (Signed)
Pt has been triaged and scheduled for 11/11/2015.

## 2015-10-17 NOTE — Telephone Encounter (Signed)
Please call pt. Alexis Mckinney stomach Bx shows gastritis.    FOLLOW A SOFT MECHANICAL DIET.  MEATS SHOULD BE CHOPPED OR GROUND ONLY. DO NOT EAT CHUNKS OF ANYTHING.  CONTINUE PLAVIX.  NEXIUM 30 MINUTES BEFORE MEALS TWICE DAILY. AVOID ASPIRIN, BC/GOODY POWDERS, IBUPROFEN/MOTRIN, OR NAPROXEN/ALEVE FOR 4 WEEKS.  REPEAT UPPER ENDOSCOPY TO COMPLETE STRETCHING YOUR ESOPHAGUS IN JUL 2017.  REPEAT UPPER ENDOSCOPY TO IN 3 MOS TO MAKE SURE YOUR ULCERS HAS HEALED.

## 2015-10-17 NOTE — Telephone Encounter (Signed)
Gastroenterology Pre-Procedure Review  Request Date: 10/16/2015 Requesting Physician: Dr. Oneida Alar  Pt was triaged for EGD/ED to complete dilation  PATIENT REVIEW QUESTIONS: The patient responded to the following health history questions as indicated:    1. Diabetes Melitis: no 2. Joint replacements in the past 12 months: no 3. Major health problems in the past 3 months: no 4. Has an artificial valve or MVP: no 5. Has a defibrillator: no 6. Has been advised in past to take antibiotics in advance of a procedure like teeth cleaning: no 7. Family history of colon cancer: no  8. Alcohol Use: no 9. History of sleep apnea: no     MEDICATIONS & ALLERGIES:    Patient reports the following regarding taking any blood thinners:   Plavix? no Aspirin? no Coumadin? no  Patient confirms/reports the following medications:  Current Outpatient Prescriptions  Medication Sig Dispense Refill  . amLODipine-benazepril (LOTREL) 5-20 MG per capsule Take 1 capsule by mouth daily.    Marland Kitchen atorvastatin (LIPITOR) 80 MG tablet Take 80 mg by mouth daily.    . clopidogrel (PLAVIX) 75 MG tablet Take 75 mg by mouth daily.    Marland Kitchen esomeprazole (NEXIUM) 40 MG capsule Take 1 capsule (40 mg total) by mouth 2 (two) times daily before a meal. 60 capsule 11  . levothyroxine (SYNTHROID, LEVOTHROID) 75 MCG tablet Take 100 mcg by mouth daily.     . metoprolol succinate (TOPROL-XL) 50 MG 24 hr tablet Take 50 mg by mouth daily. Take with or immediately following a meal.     No current facility-administered medications for this visit.    Patient confirms/reports the following allergies:  Allergies  Allergen Reactions  . Watermelon Concentrate [Citrullus Vulgaris] Hypertension    No orders of the defined types were placed in this encounter.    AUTHORIZATION INFORMATION Primary Insurance:  ID #:  Group #:  Pre-Cert / Auth required:  Pre-Cert / Auth #:    Secondary Insurance: ID #:   Group #:  Pre-Cert / Auth required:   Pre-Cert / Auth #:   SCHEDULE INFORMATION: Procedure has been scheduled as follows:  Date:  11/11/2015              Time:  12:45 PM Location:  [facility] Short Stay  This Gastroenterology Pre-Precedure Review Form is being routed to the following provider(s): Barney Drain, MD

## 2015-10-17 NOTE — Telephone Encounter (Signed)
Tried to call pt and no answer.

## 2015-10-21 NOTE — Telephone Encounter (Signed)
Pt called and is aware of the results and next procedure in July.

## 2015-10-21 NOTE — Telephone Encounter (Signed)
I called and pt was not available. Her daughter will have her call me.

## 2015-10-24 NOTE — Telephone Encounter (Signed)
Instructions for the EGD on 11/11/2015 mailed to pt.

## 2015-11-07 NOTE — Telephone Encounter (Signed)
No PA need per online

## 2015-11-08 ENCOUNTER — Telehealth: Payer: Self-pay

## 2015-11-08 NOTE — Telephone Encounter (Signed)
LMOM to call back

## 2015-11-11 ENCOUNTER — Encounter (HOSPITAL_COMMUNITY): Payer: Self-pay | Admitting: *Deleted

## 2015-11-11 ENCOUNTER — Encounter (HOSPITAL_COMMUNITY)
Admission: RE | Disposition: A | Discharge status: Home / Self Care | Payer: Self-pay | Source: Ambulatory Visit | Attending: Gastroenterology

## 2015-11-11 ENCOUNTER — Ambulatory Visit (HOSPITAL_COMMUNITY)
Admission: RE | Admit: 2015-11-11 | Discharge: 2015-11-11 | Disposition: A | Discharge status: Home / Self Care | Payer: Medicare Other | Source: Ambulatory Visit | Attending: Gastroenterology | Admitting: Gastroenterology

## 2015-11-11 DIAGNOSIS — Z87891 Personal history of nicotine dependence: Secondary | ICD-10-CM | POA: Insufficient documentation

## 2015-11-11 DIAGNOSIS — K259 Gastric ulcer, unspecified as acute or chronic, without hemorrhage or perforation: Secondary | ICD-10-CM | POA: Diagnosis not present

## 2015-11-11 DIAGNOSIS — K2970 Gastritis, unspecified, without bleeding: Secondary | ICD-10-CM | POA: Diagnosis not present

## 2015-11-11 DIAGNOSIS — M1990 Unspecified osteoarthritis, unspecified site: Secondary | ICD-10-CM | POA: Diagnosis not present

## 2015-11-11 DIAGNOSIS — R1310 Dysphagia, unspecified: Secondary | ICD-10-CM

## 2015-11-11 DIAGNOSIS — I2510 Atherosclerotic heart disease of native coronary artery without angina pectoris: Secondary | ICD-10-CM | POA: Insufficient documentation

## 2015-11-11 DIAGNOSIS — K219 Gastro-esophageal reflux disease without esophagitis: Secondary | ICD-10-CM | POA: Insufficient documentation

## 2015-11-11 DIAGNOSIS — K2980 Duodenitis without bleeding: Secondary | ICD-10-CM | POA: Insufficient documentation

## 2015-11-11 DIAGNOSIS — I10 Essential (primary) hypertension: Secondary | ICD-10-CM | POA: Insufficient documentation

## 2015-11-11 DIAGNOSIS — E039 Hypothyroidism, unspecified: Secondary | ICD-10-CM | POA: Diagnosis not present

## 2015-11-11 DIAGNOSIS — E7800 Pure hypercholesterolemia, unspecified: Secondary | ICD-10-CM | POA: Insufficient documentation

## 2015-11-11 DIAGNOSIS — Z79899 Other long term (current) drug therapy: Secondary | ICD-10-CM | POA: Insufficient documentation

## 2015-11-11 DIAGNOSIS — K222 Esophageal obstruction: Secondary | ICD-10-CM | POA: Diagnosis not present

## 2015-11-11 DIAGNOSIS — Z7902 Long term (current) use of antithrombotics/antiplatelets: Secondary | ICD-10-CM | POA: Insufficient documentation

## 2015-11-11 HISTORY — PX: ESOPHAGEAL DILATION: SHX303

## 2015-11-11 HISTORY — PX: ESOPHAGOGASTRODUODENOSCOPY: SHX5428

## 2015-11-11 SURGERY — EGD (ESOPHAGOGASTRODUODENOSCOPY)
Anesthesia: Moderate Sedation

## 2015-11-11 MED ORDER — MIDAZOLAM HCL 5 MG/5ML IJ SOLN
INTRAMUSCULAR | Status: AC
  Filled 2015-11-11: qty 5

## 2015-11-11 MED ORDER — MIDAZOLAM HCL 5 MG/5ML IJ SOLN
INTRAMUSCULAR | Status: DC | PRN
  Administered 2015-11-11 (×2): 2 mg via INTRAVENOUS

## 2015-11-11 MED ORDER — MINERAL OIL PO OIL
TOPICAL_OIL | ORAL | Status: AC
  Filled 2015-11-11: qty 30

## 2015-11-11 MED ORDER — LIDOCAINE VISCOUS 2 % MT SOLN
OROMUCOSAL | Status: AC
  Filled 2015-11-11: qty 15

## 2015-11-11 MED ORDER — STERILE WATER FOR IRRIGATION IR SOLN
Status: DC | PRN
  Administered 2015-11-11: 10:00:00

## 2015-11-11 MED ORDER — MEPERIDINE HCL 100 MG/ML IJ SOLN
INTRAMUSCULAR | Status: DC | PRN
  Administered 2015-11-11 (×3): 25 mg via INTRAVENOUS

## 2015-11-11 MED ORDER — LIDOCAINE VISCOUS 2 % MT SOLN
OROMUCOSAL | Status: DC | PRN
  Administered 2015-11-11: 1 via OROMUCOSAL

## 2015-11-11 MED ORDER — SODIUM CHLORIDE 0.9 % IV SOLN
INTRAVENOUS | Status: DC
  Administered 2015-11-11: 09:00:00 via INTRAVENOUS

## 2015-11-11 MED ORDER — MEPERIDINE HCL 100 MG/ML IJ SOLN
INTRAMUSCULAR | Status: AC
  Filled 2015-11-11: qty 1

## 2015-11-11 NOTE — H&P (Signed)
Primary Care Physician:  Marval Regal, MD Primary Gastroenterologist:  Dr. Oneida Alar  Pre-Procedure History & Physical: HPI:  Alexis Mckinney is a 75 y.o. female here for DYSPHAGIA.  Past Medical History  Diagnosis Date  . Arthritis   . HTN (hypertension)   . High cholesterol   . Hypothyroidism   . GERD (gastroesophageal reflux disease)   . Ulcer     EGD 2007, states had bleeding ulcer, secondary to ASA and Plavix  . Coronary artery disease     Past Surgical History  Procedure Laterality Date  . Tonsillectomy    . Abdominal hysterectomy    . Eye surgery      lens implant left eye  . Colonoscopy  [facility]  . Esophagogastroduodenoscopy  2007  . Colonoscopy  01/01/2012    Dr. Oneida Alar: diverticulosis, sessile polyps (hyperplastic) due for surveillance in 2018 due to history of multiple colonic polyps   . Cardiac cath with stents      [facility]   . Esophagogastroduodenoscopy N/A 09/30/2015    Procedure: ESOPHAGOGASTRODUODENOSCOPY (EGD);  Surgeon: Danie Binder, MD;  Location: [facility];  Service: Endoscopy;  Laterality: N/A;  1500  . Savory dilation N/A 09/30/2015    Procedure: SAVORY DILATION;  Surgeon: Danie Binder, MD;  Location: [facility];  Service: Endoscopy;  Laterality: N/A;    Prior to Admission medications   Medication Sig Start Date End Date Taking? Authorizing Provider  amLODipine-benazepril (LOTREL) 5-20 MG per capsule Take 1 capsule by mouth daily.   Yes Historical Provider, MD  atorvastatin (LIPITOR) 80 MG tablet Take 80 mg by mouth daily.   Yes Historical Provider, MD  clopidogrel (PLAVIX) 75 MG tablet Take 75 mg by mouth daily.   Yes Historical Provider, MD  esomeprazole (NEXIUM) 40 MG capsule Take 1 capsule (40 mg total) by mouth 2 (two) times daily before a meal. 09/30/15  Yes Danie Binder, MD  levothyroxine (SYNTHROID, LEVOTHROID) 75 MCG tablet Take 100 mcg by mouth daily.    Yes Historical Provider, MD  metoprolol succinate  (TOPROL-XL) 50 MG 24 hr tablet Take 50 mg by mouth daily. Take with or immediately following a meal.   Yes Historical Provider, MD    Allergies as of 10/17/2015 - Review Complete 10/16/2015  Allergen Reaction Noted  . Watermelon concentrate [citrullus vulgaris] Hypertension 01/01/2012    Family History  Problem Relation Age of Onset  . Prostate cancer Brother   . Colon cancer Neg Hx   . Heart attack Father   . Aneurysm Sister   . Aneurysm Brother   . Lung disease Brother     Social History   Social History  . Marital Status: Single    Spouse Name: N/A  . Number of Children: N/A  . Years of Education: N/A   Occupational History  . Not on file.   Social History Main Topics  . Smoking status: Former Smoker -- 1.00 packs/day    Types: Cigarettes  . Smokeless tobacco: Former Systems developer    Quit date: 04/28/1978  . Alcohol Use: No  . Drug Use: No  . Sexual Activity: Not on file   Other Topics Concern  . Not on file   Social History Narrative    Review of Systems: See HPI, otherwise negative ROS   Physical Exam: BP 146/71 mmHg  Pulse 55  Temp(Src) 97.7 F (36.5 C) (Oral)  Resp 16  Ht 5\' 7"  (1.702 m)  Wt 185 lb (  83.915 kg)  BMI 28.97 kg/m2  SpO2 100% General:   Alert,  pleasant and cooperative in NAD Head:  Normocephalic and atraumatic. Neck:  Supple; Lungs:  Clear throughout to auscultation.    Heart:  Regular rate and rhythm. Abdomen:  Soft, nontender and nondistended. Normal bowel sounds, without guarding, and without rebound.   Neurologic:  Alert and  oriented x4;  grossly normal neurologically.  Impression/Plan:     DYSPHAGIA  PLAN:  EGD/DIL TODAY

## 2015-11-11 NOTE — Op Note (Signed)
[facility] Patient Name: Alexis Mckinney Procedure Date: 11/11/2015 9:21 AM MRN: [id number] Date of Birth: 04-17-1941 Attending MD: Barney Drain , MD CSN: [id number] Age: 76 Admit Type: Outpatient Procedure:                Upper GI endoscopy WITH ESOPHAGEAL DILATION Indications:              Dysphagia Providers:                Barney Drain, MD, Janeece Riggers, RN, Isabella Stalling,                            Technician Referring MD:             Manual Meier. Comstock, MD Medicines:                Meperidine 75 mg IV, Midazolam 4 mg IV Complications:            No immediate complications. Estimated Blood Loss:     Estimated blood loss was minimal. Procedure:                Pre-Anesthesia Assessment:                           - Prior to the procedure, a History and Physical                            was performed, and patient medications and                            allergies were reviewed. The patient's tolerance of                            previous anesthesia was also reviewed. The risks                            and benefits of the procedure and the sedation                            options and risks were discussed with the patient.                            All questions were answered, and informed consent                            was obtained. Prior Anticoagulants: The patient has                            taken Plavix (clopidogrel), last dose was day of                            procedure. ASA Grade Assessment: II - A patient                            with mild systemic disease. After reviewing the  risks and benefits, the patient was deemed in                            satisfactory condition to undergo the procedure.                           After obtaining informed consent, the endoscope was                            passed under direct vision. Throughout the                            procedure, the patient's blood pressure, pulse, and                       oxygen saturations were monitored continuously. The                            [id number]) scope was introduced through the                            mouth, and advanced to the second part of duodenum.                            The upper GI endoscopy was accomplished without                            difficulty. The patient tolerated the procedure                            well. Scope In: 9:50:13 AM Scope Out: 9:58:55 AM Total Procedure Duration: 0 hours 8 minutes 42 seconds  Findings:      One moderate (circumferential scarring or stenosis; an endoscope may       pass) benign-appearing, intrinsic stenosis was found. This measured 1.4       cm (inner diameter) and was traversed. PRIOR TO DILATION COLD FORCEPS       USED TO DISRUPT STRICTUREx2). A guidewire was placed and the scope was       withdrawn. Dilation was performed with a Savary dilator with mild       resistance at 14 mm, 15 mm, 16 mm and 17 mm.      One non-bleeding cratered gastric ulcer with no stigmata of bleeding was       found in the gastric antrum.      Scattered moderate inflammation characterized by congestion (edema),       erosions and erythema was found in the stomach.      Scattered mild inflammation characterized by congestion (edema) and       erythema was found in the duodenal bulb.      A medium-sized hiatal hernia was present. 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                           - MODERTAE Gastritis.                           - MILD Duodenitis. Moderate Sedation:      Moderate (conscious) sedation was administered by the endoscopy nurse       and supervised by the endoscopist. The following parameters were       monitored: oxygen saturation, heart rate, blood pressure, and response       to care. Total physician intraservice  time was 17 minutes. Recommendation:           - Low fat diet.                           - Continue present medications-NEXIUM BID.                           - Repeat upper endoscopy in 3 months for                            surveillance.                           - Return to GI office in 6 months.                           - Patient has a contact number available for                            emergencies. The signs and symptoms of potential                            delayed complications were discussed with the                            patient. Return to normal activities tomorrow.                            Written discharge instructions were provided to the                            patient. Procedure Code(s):        --- Professional ---                           [phone], Esophagogastroduodenoscopy, flexible,                            transoral; with insertion of guide wire followed by                            passage of dilator(s) through esophagus over guide                            wire  [id number], Moderate sedation services provided by the                            same physician or other qualified health care                            professional performing the diagnostic or                            therapeutic service that the sedation supports,                            requiring the presence of an independent trained                            observer to assist in the monitoring of the                            patient's level of consciousness and physiological                            status; initial 15 minutes of intraservice time,                            patient age 38 years or older Diagnosis Code(s):        --- Professional ---                           K22.2, Esophageal obstruction                           K25.9, Gastric ulcer, unspecified as acute or                            chronic, without hemorrhage or perforation                            K29.70, Gastritis, unspecified, without bleeding                           K29.80, Duodenitis without bleeding                           R13.10, Dysphagia, unspecified CPT copyright 2016 American Medical Association. All rights reserved. The codes documented in this report are preliminary and upon coder review may  be revised to meet current compliance requirements. Barney Drain, MD Barney Drain, MD 11/11/2015 10:15:41 AM This report has been signed electronically. Number of Addenda: 0

## 2015-11-11 NOTE — Discharge Instructions (Signed)
I dilated your esophagus. You have a stricture near the base of your esophagus LIKELY DUE TO DAMAGE FROM ACID REFLUX. You have a small hiatal hernia. You have gastritis, DUODENITIS, and YOUR STOMACH ULCER IS SMALLER.    DRINK WATER TO KEEP YOUR URINE LIGHT YELLOW.  FOLLOW A LOW FAT DIET. MEATS SHOULD BE BAKED, BROILED, OR BOILED. AVOID FRIED FOODS. SEE INFO BELOW.  CONTINUE NEXIUM. TAKE 30 MINUTES BEFORE MEALS TWICE DAILY.  REPEAT EGD IN OCT 2017 TO CONFIRM YOUR ULCERS ARE HEALED  FOLLOW UP IN 6 MOS.   UPPER ENDOSCOPY AFTER CARE Read the instructions outlined below and refer to this sheet in the next week. These discharge instructions provide you with general information on caring for yourself after you leave the hospital. While your treatment has been planned according to the most current medical practices available, unavoidable complications occasionally occur. If you have any problems or questions after discharge, call DR. Eleana Tocco, [phone].  ACTIVITY  You may resume your regular activity, but move at a slower pace for the next 24 hours.   Take frequent rest periods for the next 24 hours.   Walking will help get rid of the air and reduce the bloated feeling in your belly (abdomen).   No driving for 24 hours (because of the medicine (anesthesia) used during the test).   You may shower.   Do not sign any important legal documents or operate any machinery for 24 hours (because of the anesthesia used during the test).    NUTRITION  Drink plenty of fluids.   You may resume your normal diet as instructed by your doctor.   Begin with a light meal and progress to your normal diet. Heavy or fried foods are harder to digest and may make you feel sick to your stomach (nauseated).   Avoid alcoholic beverages for 24 hours or as instructed.    MEDICATIONS  You may resume your normal medications.   WHAT YOU CAN EXPECT TODAY  Some feelings of bloating in the abdomen.    Passage of more gas than usual.    IF YOU HAD A BIOPSY TAKEN DURING THE UPPER ENDOSCOPY:  Eat a soft diet IF YOU HAVE NAUSEA, BLOATING, ABDOMINAL PAIN, OR VOMITING.    FINDING OUT THE RESULTS OF YOUR TEST Not all test results are available during your visit. DR. Oneida Alar WILL CALL YOU WITHIN 7 DAYS OF YOUR PROCEDUE WITH YOUR RESULTS. Do not assume everything is normal if you have not heard from DR. Dasean Brow IN ONE WEEK, CALL HER OFFICE AT [phone].  SEEK IMMEDIATE MEDICAL ATTENTION AND CALL THE OFFICE: [phone] IF:  You have more than a spotting of blood in your stool.   Your belly is swollen (abdominal distention).   You are nauseated or vomiting.   You have a temperature over 101F.   You have abdominal pain or discomfort that is severe or gets worse throughout the day.    Low-Fat Diet BREADS, CEREALS, PASTA, RICE, DRIED PEAS, AND BEANS These products are high in carbohydrates and most are low in fat. Therefore, they can be increased in the diet as substitutes for fatty foods. They too, however, contain calories and should not be eaten in excess. Cereals can be eaten for snacks as well as for breakfast.  Include foods that contain fiber (fruits, vegetables, whole grains, and legumes). Research shows that fiber may lower blood cholesterol levels, especially the water-soluble fiber found in fruits, vegetables, oat products, and legumes. FRUITS AND VEGETABLES It  is good to eat fruits and vegetables. Besides being sources of fiber, both are rich in vitamins and some minerals. They help you get the daily allowances of these nutrients. Fruits and vegetables can be used for snacks and desserts. MEATS Limit lean meat, chicken, [place], and fish to no more than 6 ounces per day. Beef, Pork, and Lamb Use lean cuts of beef, pork, and lamb. Lean cuts include:  Extra-lean ground beef.  Arm roast.  Sirloin tip.  Center-cut ham.  Round steak.  Loin chops.  Rump roast.  Tenderloin.   Trim all fat off the outside of meats before cooking. It is not necessary to severely decrease the intake of red meat, but lean choices should be made. Lean meat is rich in protein and contains a highly absorbable form of iron. Premenopausal women, in particular, should avoid reducing lean red meat because this could increase the risk for low red blood cells (iron-deficiency anemia).  Chicken and [place] These are good sources of protein. The fat of poultry can be reduced by removing the skin and underlying fat layers before cooking. Chicken and [place] can be substituted for lean red meat in the diet. Poultry should not be fried or covered with high-fat sauces. Fish and Shellfish Fish is a good source of protein. Shellfish contain cholesterol, but they usually are low in saturated fatty acids. The preparation of fish is important. Like chicken and [place], they should not be fried or covered with high-fat sauces. EGGS Egg whites contain no fat or cholesterol. They can be eaten often. Try 1 to 2 egg whites instead of whole eggs in recipes or use egg substitutes that do not contain yolk.  MILK AND DAIRY PRODUCTS Use skim or 1% milk instead of 2% or whole milk. Decrease whole milk, natural, and processed cheeses. Use nonfat or low-fat (2%) cottage cheese or low-fat cheeses made from vegetable oils. Choose nonfat or low-fat (1 to 2%) yogurt. Experiment with evaporated skim milk in recipes that call for heavy cream. Substitute low-fat yogurt or low-fat cottage cheese for sour cream in dips and salad dressings. Have at least 2 servings of low-fat dairy products, such as 2 glasses of skim (or 1%) milk each day to help get your daily calcium intake.  FATS AND OILS Butterfat, lard, and beef fats are high in saturated fat and cholesterol. These should be avoided.Vegetable fats do not contain cholesterol. AVOID coconut oil, palm oil, and palm kernel oil, WHICH are very high in saturated fats. These should be  limited. These fats are often used in bakery goods, processed foods, popcorn, oils, and nondairy creamers. Vegetable shortenings and some peanut butters contain hydrogenated oils, which are also saturated fats. Read the labels on these foods and check for saturated vegetable oils.  Desirable liquid vegetable oils are corn oil, cottonseed oil, olive oil, canola oil, safflower oil, soybean oil, and sunflower oil. Peanut oil is not as good, but small amounts are acceptable. Buy a heart-healthy tub margarine that has no partially hydrogenated oils in the ingredients. AVOID Mayonnaise and salad dressings often are made from unsaturated fats.  OTHER EATING TIPS Snacks  Most sweets should be limited as snacks. They tend to be rich in calories and fats, and their caloric content outweighs their nutritional value. Some good choices in snacks are graham crackers, melba toast, soda crackers, bagels (no egg), English muffins, fruits, and vegetables. These snacks are preferable to snack crackers, [place] fries, and chips. Popcorn should be air-popped or cooked in small  amounts of liquid vegetable oil.  Desserts Eat fruit, low-fat yogurt, and fruit ices instead of pastries, cake, and cookies. Sherbet, angel food cake, gelatin dessert, frozen low-fat yogurt, or other frozen products that do not contain saturated fat (pure fruit juice bars, frozen ice pops) are also acceptable.   COOKING METHODS Choose those methods that use little or no fat. They include: Poaching.  Braising.  Steaming.  Grilling.  Baking.  Stir-frying.  Broiling.  Microwaving.  Foods can be cooked in a nonstick pan without added fat, or use a nonfat cooking spray in regular cookware. Limit fried foods and avoid frying in saturated fat. Add moisture to lean meats by using water, broth, cooking wines, and other nonfat or low-fat sauces along with the cooking methods mentioned above. Soups and stews should be chilled after cooking. The fat that  forms on top after a few hours in the refrigerator should be skimmed off. When preparing meals, avoid using excess salt. Salt can contribute to raising blood pressure in some people.  EATING AWAY FROM HOME Order entres, potatoes, and vegetables without sauces or butter. When meat exceeds the size of a deck of cards (3 to 4 ounces), the rest can be taken home for another meal. Choose vegetable or fruit salads and ask for low-calorie salad dressings to be served on the side. Use dressings sparingly. Limit high-fat toppings, such as bacon, crumbled eggs, cheese, sunflower seeds, and olives. Ask for heart-healthy tub margarine instead of butter.   Gastritis/DUODENITIS  Gastritis is an inflammation (the body's way of reacting to injury and/or infection) of the stomach. DUODENITIS is an inflammation (the body's way of reacting to injury and/or infection) of the FIRST PART OF THE SMALL INTESTINES. It is often caused by bacterial (germ) infections. It can also be caused BY ASPIRIN, BC/GOODY POWDER'S, (IBUPROFEN) MOTRIN, OR ALEVE (NAPROXEN), chemicals (including alcohol), SPICY FOODS, and medications. This illness may be associated with generalized malaise (feeling tired, not well), UPPER ABDOMINAL STOMACH cramps, and fever. One common bacterial cause of gastritis is an organism known as H. Pylori. This can be treated with antibiotics.    Hiatal Hernia A hiatal hernia occurs when a part of the stomach slides above the diaphragm. The diaphragm is the thin muscle separating the belly (abdomen) from the chest. A hiatal hernia can be something you are born with or develop over time. Hiatal hernias may allow stomach acid to flow back into your esophagus, the tube which carries food from your mouth to your stomach. If this acid causes problems it is called GERD (gastro-esophageal reflux disease).   SYMPTOMS Common symptoms of GERD are heartburn (burning in your chest). This is worse when lying down or bending  over. It may also cause belching and indigestion. Some of the things which make GERD worse are:  Increased weight pushes on stomach making acid rise more easily.   Smoking markedly increases acid production.   Alcohol decreases lower esophageal sphincter pressure (valve between stomach and esophagus), allowing acid from stomach into esophagus.   Late evening meals trus fruits and all other foods and drinks that contain acid and that seem to increase the problems.   Avoid bending over, especially after eating OR STRAINING.   HOME CARE INSTRUCTIONS  Try to achieve and maintain an ideal body weight.   Avoid drinking alcoholic beverages.   DO NOT smokE.   Do not wear tight clothing around your chest or stomach.   Eat smaller meals and eat more frequently. This keeps  your stomach from getting too full. Eat slowly.   Do not lie down for 2 or 3 hours after eating. Do not eat or drink anything 1 to 2 hours before going to bed.   Avoid caffeine beverages (colas, coffee, cocoa, tea), AND fatty foods   ESOPHAGEAL STRICTURE  Esophageal strictures can be caused by stomach acid backing up into the tube that carries food from the mouth down to the stomach (lower esophagus).  TREATMENT There are a number of medicines used to treat reflux/stricture, including: Antacids.  Proton-pump inhibitors: NEXIUM  HOME CARE INSTRUCTIONS Eat 2-3 hours before going to bed.  Try to reach and maintain a healthy weight.  Do not eat just a few very large meals. Instead, eat 4 TO 6 smaller meals throughout the day.  Try to identify foods and beverages that make your symptoms worse, and avoid these.  Avoid tight clothing.  Do not exercise right after eating.

## 2015-11-13 ENCOUNTER — Encounter (HOSPITAL_COMMUNITY): Payer: Self-pay | Admitting: Gastroenterology

## 2015-12-31 ENCOUNTER — Ambulatory Visit: Payer: Medicare Other | Admitting: Gastroenterology

## 2016-01-27 ENCOUNTER — Other Ambulatory Visit (HOSPITAL_COMMUNITY): Payer: Self-pay | Admitting: Orthopedic Surgery

## 2016-01-27 DIAGNOSIS — M5416 Radiculopathy, lumbar region: Secondary | ICD-10-CM

## 2016-02-04 ENCOUNTER — Ambulatory Visit (HOSPITAL_COMMUNITY)
Admission: RE | Admit: 2016-02-04 | Discharge: 2016-02-04 | Disposition: A | Discharge status: Home / Self Care | Payer: Medicare Other | Source: Ambulatory Visit | Attending: Orthopedic Surgery | Admitting: Orthopedic Surgery

## 2016-02-04 DIAGNOSIS — M5416 Radiculopathy, lumbar region: Secondary | ICD-10-CM | POA: Diagnosis present

## 2016-02-04 DIAGNOSIS — M4186 Other forms of scoliosis, lumbar region: Secondary | ICD-10-CM | POA: Diagnosis not present

## 2016-02-04 DIAGNOSIS — D1809 Hemangioma of other sites: Secondary | ICD-10-CM | POA: Diagnosis not present

## 2016-02-04 DIAGNOSIS — M4726 Other spondylosis with radiculopathy, lumbar region: Secondary | ICD-10-CM | POA: Insufficient documentation

## 2016-04-06 ENCOUNTER — Encounter: Payer: Self-pay | Admitting: Gastroenterology

## 2016-05-13 ENCOUNTER — Ambulatory Visit: Payer: Medicare Other | Admitting: Gastroenterology

## 2016-05-14 ENCOUNTER — Ambulatory Visit: Payer: Medicare Other | Admitting: Gastroenterology

## 2016-05-28 ENCOUNTER — Ambulatory Visit: Payer: Medicare Other | Admitting: Gastroenterology

## 2016-06-17 ENCOUNTER — Encounter: Payer: Self-pay | Admitting: Gastroenterology

## 2016-06-17 ENCOUNTER — Ambulatory Visit (INDEPENDENT_AMBULATORY_CARE_PROVIDER_SITE_OTHER): Payer: Medicare Other | Admitting: Gastroenterology

## 2016-06-17 ENCOUNTER — Other Ambulatory Visit: Payer: Self-pay

## 2016-06-17 VITALS — BP 163/85 | HR 71 | Temp 97.6°F | Ht 67.0 in | Wt 203.6 lb

## 2016-06-17 DIAGNOSIS — K279 Peptic ulcer, site unspecified, unspecified as acute or chronic, without hemorrhage or perforation: Secondary | ICD-10-CM

## 2016-06-17 DIAGNOSIS — Z860100 Personal history of colon polyps, unspecified: Secondary | ICD-10-CM

## 2016-06-17 DIAGNOSIS — K222 Esophageal obstruction: Secondary | ICD-10-CM

## 2016-06-17 DIAGNOSIS — R1319 Other dysphagia: Secondary | ICD-10-CM

## 2016-06-17 DIAGNOSIS — R1310 Dysphagia, unspecified: Secondary | ICD-10-CM

## 2016-06-17 MED ORDER — PEG 3350-KCL-NA BICARB-NACL 420 G PO SOLR: 4000.0000 mL | ORAL | 0 refills | Status: DC

## 2016-06-17 NOTE — Progress Notes (Signed)
CC'ED TO PCP 

## 2016-06-17 NOTE — Assessment & Plan Note (Signed)
History of esophageal stricture. Dysphagia with meat, bread. Does well with soft foods. Dilation as appropriate.

## 2016-06-17 NOTE — Patient Instructions (Signed)
We have scheduled you for an upper endoscopy and possible dilation with Dr. Oneida Alar. At the same time, we will do a colonoscopy as you are due this year.  Continue Nexium every day. AVOID ALEVE if it all possible.

## 2016-06-17 NOTE — Assessment & Plan Note (Signed)
Multiple polyps 2013. In fairly good health, and as she is due for an EGD now, will proceed with colonoscopy now as surveillance is due this year. Patient desires to get this done now at time of EGD.   Proceed with colonoscopy with Dr. Oneida Alar in the near future. The risks, benefits, and alternatives have been discussed in detail with the patient. They state understanding and desire to proceed.

## 2016-06-17 NOTE — Progress Notes (Addendum)
REVIEWED-NO ADDITIONAL RECOMMENDATIONS.  Referring Provider: Marval Regal, MD Primary Care Physician:  Marval Regal, MD Primary GI: Dr. Oneida Alar   Chief Complaint  Patient presents with  . Dysphagia    HPI:   Alexis Mckinney is a 76 y.o. female presenting today with a history of esophageal stricture requiring dilations (June and July 2017), with evidence of gastric ulcer, non-H.pylori. Quite overdue for 3 month surveillance and had to cancel several previously scheduled appointments. Colonoscopy due this year if health permits due to history of multiple polyps in the past.   No abdominal pain. Dysphagia with bread, chicken, steak. Ok with soft foods. Appetite is good. No blood in stool. No N/V. Takes milk of magnesia for constipation, which helps. Patient would like to go ahead and proceed with a colonoscopy at time of EGD. Takes Aleve in the morning and at night.   Loves mac n cheese and states "I'm going to have to cut down on that".   Past Medical History:  Diagnosis Date  . Arthritis   . Coronary artery disease   . GERD (gastroesophageal reflux disease)   . High cholesterol   . HTN (hypertension)   . Hypothyroidism   . Ulcer ([place])    EGD 2007, states had bleeding ulcer, secondary to ASA and Plavix    Past Surgical History:  Procedure Laterality Date  . ABDOMINAL HYSTERECTOMY    . cardiac cath with stents     [facility]   . COLONOSCOPY  2010   polyps/[facility]  . COLONOSCOPY  01/01/2012   Dr. Oneida Alar: diverticulosis, sessile polyps (hyperplastic) due for surveillance in 2018 due to history of multiple colonic polyps   . ESOPHAGEAL DILATION N/A 11/11/2015   Procedure: ESOPHAGEAL DILATION;  Surgeon: Danie Binder, MD;  Location: [facility];  Service: Endoscopy;  Laterality: N/A;  . ESOPHAGOGASTRODUODENOSCOPY  2007  . ESOPHAGOGASTRODUODENOSCOPY N/A 09/30/2015   Dr. Oneida Alar: esophageal stenosis s/p dilation, oozing gastric ulcer with cautery and injected,  multiple non-bleeding duodenal ulcers, negative H.pylori   . ESOPHAGOGASTRODUODENOSCOPY N/A 11/11/2015   Dr. Oneida Alar: dysphagia due to esophageal stricture s/p dilation, non-bleeding gastric ulcer, moderate gastritis, mild duodenitis, needs 3 month surveillance  . EYE SURGERY     lens implant left eye  . SAVORY DILATION N/A 09/30/2015   Procedure: SAVORY DILATION;  Surgeon: Danie Binder, MD;  Location: [facility];  Service: Endoscopy;  Laterality: N/A;  . TONSILLECTOMY      Current Outpatient Prescriptions  Medication Sig Dispense Refill  . amLODipine-benazepril (LOTREL) 5-20 MG per capsule Take 1 capsule by mouth daily.    Marland Kitchen atorvastatin (LIPITOR) 80 MG tablet Take 80 mg by mouth daily.    . clopidogrel (PLAVIX) 75 MG tablet Take 75 mg by mouth daily.    Marland Kitchen esomeprazole (NEXIUM) 40 MG capsule Take 40 mg by mouth daily at 12 noon.    Marland Kitchen levothyroxine (SYNTHROID, LEVOTHROID) 75 MCG tablet Take 100 mcg by mouth daily.     . metoprolol succinate (TOPROL-XL) 50 MG 24 hr tablet Take 50 mg by mouth daily. Take with or immediately following a meal.    . ranitidine (ZANTAC) 150 MG tablet Take 150 mg by mouth 2 (two) times daily.     No current facility-administered medications for this visit.     Allergies as of 06/17/2016 - Review Complete 06/17/2016  Allergen Reaction Noted  . Other Anaphylaxis 11/11/2015  . Watermelon concentrate [citrullus vulgaris] Hypertension 01/01/2012    Family History  Problem Relation Age of Onset  . Prostate cancer Brother   . Heart attack Father   . Aneurysm Sister   . Aneurysm Brother   . Lung disease Brother   . Colon cancer Neg Hx     Social History   Social History  . Marital status: Single    Spouse name: N/A  . Number of children: N/A  . Years of education: N/A   Social History Main Topics  . Smoking status: Former Smoker    Packs/day: 1.00    Types: Cigarettes  . Smokeless tobacco: Former Systems developer    Quit date: 04/28/1978  . Alcohol use No    . Drug use: No  . Sexual activity: Not on file   Other Topics Concern  . Not on file   Social History Narrative  . No narrative on file    Review of Systems: Gen: Denies fever, chills, anorexia. Denies fatigue, weakness, weight loss.  CV: Denies chest pain, palpitations, syncope, peripheral edema, and claudication. Resp: Denies dyspnea at rest, cough, wheezing, coughing up blood, and pleurisy. GI: see HPI  Derm: Denies rash, itching, dry skin Psych: Denies depression, anxiety, memory loss, confusion. No homicidal or suicidal ideation.  Heme: see HPI   Physical Exam: BP (!) 163/85   Pulse 71   Temp 97.6 F (36.4 C) (Oral)   Ht _0  (1.702 m)   Wt 203 lb 9.6 oz (92.4 kg)   BMI 31.89 kg/m  General:   Alert and oriented. No distress noted. Pleasant and cooperative.  Head:  Normocephalic and atraumatic. Eyes:  Conjuctiva clear without scleral icterus. Mouth:  Oral mucosa pink and moist. Good dentition. No lesions. Heart:  S1, S2 present without murmurs, rubs, or gallops. Regular rate and rhythm. Abdomen:  +BS, soft, non-tender and non-distended. No rebound or guarding. No HSM or masses noted. Msk:  Symmetrical without gross deformities. Normal posture. Extremities:  Without edema. Neurologic:  Alert and  oriented x4;  grossly normal neurologically. Psych:  Alert and cooperative. Normal mood and affect.

## 2016-06-17 NOTE — Assessment & Plan Note (Signed)
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 I have asked her to stop this and contact her PCP for other options. Overdue for surveillance EGD now. No abdominal pain, has good appetite, no alarm features. History of esophageal stricture with dilation X 2 last year, and she notes dysphagia with bread and meats, otherwise does well if eating soft food. Will add possible dilation if evidence of persistent stricture. May be underlying motility disorder as well.   Proceed with upper endoscopy +/- dilation in the near future with Dr. Oneida Alar. The risks, benefits, and alternatives have been discussed in detail with patient. They have stated understanding and desire to proceed.  Continue Nexium Avoid NSAIDs if at all possible

## 2016-06-18 ENCOUNTER — Telehealth: Payer: Self-pay

## 2016-06-18 ENCOUNTER — Other Ambulatory Visit: Payer: Self-pay

## 2016-06-18 MED ORDER — PEG-KCL-NACL-NASULF-NA ASC-C 100 G PO SOLR: 1.0000 | ORAL | 0 refills | Status: DC

## 2016-06-18 NOTE — Telephone Encounter (Signed)
Received fax from [place]. [facility] is on back order. They have Moviprep in stock. Sent rx for Moviprep. New instructions printed. Called and informed pt. Mailing instructions for Moviprep to pt.

## 2016-07-06 ENCOUNTER — Encounter (HOSPITAL_COMMUNITY): Payer: Self-pay | Admitting: *Deleted

## 2016-07-06 ENCOUNTER — Ambulatory Visit (HOSPITAL_COMMUNITY)
Admission: RE | Admit: 2016-07-06 | Discharge: 2016-07-06 | Disposition: A | Discharge status: Home / Self Care | Payer: Medicare Other | Source: Ambulatory Visit | Attending: Gastroenterology | Admitting: Gastroenterology

## 2016-07-06 ENCOUNTER — Encounter (HOSPITAL_COMMUNITY)
Admission: RE | Disposition: A | Discharge status: Home / Self Care | Payer: Self-pay | Source: Ambulatory Visit | Attending: Gastroenterology

## 2016-07-06 DIAGNOSIS — Z87891 Personal history of nicotine dependence: Secondary | ICD-10-CM | POA: Diagnosis not present

## 2016-07-06 DIAGNOSIS — K219 Gastro-esophageal reflux disease without esophagitis: Secondary | ICD-10-CM | POA: Insufficient documentation

## 2016-07-06 DIAGNOSIS — K222 Esophageal obstruction: Secondary | ICD-10-CM

## 2016-07-06 DIAGNOSIS — I2510 Atherosclerotic heart disease of native coronary artery without angina pectoris: Secondary | ICD-10-CM | POA: Diagnosis not present

## 2016-07-06 DIAGNOSIS — Q438 Other specified congenital malformations of intestine: Secondary | ICD-10-CM | POA: Diagnosis not present

## 2016-07-06 DIAGNOSIS — Z86010 Personal history of colonic polyps: Secondary | ICD-10-CM | POA: Insufficient documentation

## 2016-07-06 DIAGNOSIS — Z1211 Encounter for screening for malignant neoplasm of colon: Secondary | ICD-10-CM | POA: Insufficient documentation

## 2016-07-06 DIAGNOSIS — Z7902 Long term (current) use of antithrombotics/antiplatelets: Secondary | ICD-10-CM | POA: Insufficient documentation

## 2016-07-06 DIAGNOSIS — K259 Gastric ulcer, unspecified as acute or chronic, without hemorrhage or perforation: Secondary | ICD-10-CM | POA: Diagnosis not present

## 2016-07-06 DIAGNOSIS — E039 Hypothyroidism, unspecified: Secondary | ICD-10-CM | POA: Diagnosis not present

## 2016-07-06 DIAGNOSIS — R1310 Dysphagia, unspecified: Secondary | ICD-10-CM | POA: Diagnosis not present

## 2016-07-06 DIAGNOSIS — Z79899 Other long term (current) drug therapy: Secondary | ICD-10-CM | POA: Insufficient documentation

## 2016-07-06 DIAGNOSIS — I10 Essential (primary) hypertension: Secondary | ICD-10-CM | POA: Insufficient documentation

## 2016-07-06 DIAGNOSIS — K3189 Other diseases of stomach and duodenum: Secondary | ICD-10-CM | POA: Diagnosis not present

## 2016-07-06 DIAGNOSIS — E7800 Pure hypercholesterolemia, unspecified: Secondary | ICD-10-CM | POA: Insufficient documentation

## 2016-07-06 DIAGNOSIS — K5730 Diverticulosis of large intestine without perforation or abscess without bleeding: Secondary | ICD-10-CM | POA: Diagnosis not present

## 2016-07-06 DIAGNOSIS — Z8711 Personal history of peptic ulcer disease: Secondary | ICD-10-CM | POA: Insufficient documentation

## 2016-07-06 DIAGNOSIS — K279 Peptic ulcer, site unspecified, unspecified as acute or chronic, without hemorrhage or perforation: Secondary | ICD-10-CM | POA: Diagnosis not present

## 2016-07-06 DIAGNOSIS — K644 Residual hemorrhoidal skin tags: Secondary | ICD-10-CM | POA: Insufficient documentation

## 2016-07-06 HISTORY — PX: ESOPHAGOGASTRODUODENOSCOPY: SHX5428

## 2016-07-06 HISTORY — PX: COLONOSCOPY: SHX5424

## 2016-07-06 HISTORY — PX: SAVORY DILATION: SHX5439

## 2016-07-06 SURGERY — COLONOSCOPY
Anesthesia: Moderate Sedation

## 2016-07-06 MED ORDER — LIDOCAINE VISCOUS 2 % MT SOLN
OROMUCOSAL | Status: AC
  Filled 2016-07-06: qty 15

## 2016-07-06 MED ORDER — STERILE WATER FOR IRRIGATION IR SOLN
Status: DC | PRN
  Administered 2016-07-06: 10:00:00

## 2016-07-06 MED ORDER — SODIUM CHLORIDE 0.9 % IV SOLN
INTRAVENOUS | Status: DC
  Administered 2016-07-06: 09:00:00 via INTRAVENOUS

## 2016-07-06 MED ORDER — MIDAZOLAM HCL 5 MG/5ML IJ SOLN
INTRAMUSCULAR | Status: AC
  Filled 2016-07-06: qty 5

## 2016-07-06 MED ORDER — MEPERIDINE HCL 100 MG/ML IJ SOLN
INTRAMUSCULAR | Status: AC
  Filled 2016-07-06: qty 1

## 2016-07-06 MED ORDER — MIDAZOLAM HCL 5 MG/5ML IJ SOLN
INTRAMUSCULAR | Status: DC | PRN
  Administered 2016-07-06: 2 mg via INTRAVENOUS
  Administered 2016-07-06 (×2): 1 mg via INTRAVENOUS
  Administered 2016-07-06: 2 mg via INTRAVENOUS

## 2016-07-06 MED ORDER — MINERAL OIL PO OIL
TOPICAL_OIL | ORAL | Status: AC
  Filled 2016-07-06: qty 30

## 2016-07-06 MED ORDER — MEPERIDINE HCL 100 MG/ML IJ SOLN
INTRAMUSCULAR | Status: DC | PRN
  Administered 2016-07-06 (×4): 25 mg via INTRAVENOUS

## 2016-07-06 NOTE — Op Note (Signed)
[facility] Patient Name: Alexis Mckinney Procedure Date: 07/06/2016 10:38 AM MRN: [id number] Date of Birth: 04-04-1941 Attending MD: Barney Drain , MD CSN: [id number] Age: 76 Admit Type: Outpatient Procedure:                Upper GI endoscopy WITH COLD BIOPSY/ESOPHAGEAL                            DILATION Indications:              Dysphagia, Personal history of peptic ulcer disease Providers:                Barney Drain, MD, Janeece Riggers, RN, [facility],                            Technician Referring MD:             Manual Meier. Comstock, MD Medicines:                Meperidine 25 mg IV Complications:            No immediate complications. Estimated Blood Loss:     Estimated blood loss was minimal. Procedure:                Pre-Anesthesia Assessment:                           - Prior to the procedure, a History and Physical                            was performed, and patient medications and                            allergies were reviewed. The patient's tolerance of                            previous anesthesia was also reviewed. The risks                            and benefits of the procedure and the sedation                            options and risks were discussed with the patient.                            All questions were answered, and informed consent                            was obtained. Prior Anticoagulants: The patient has                            taken Plavix (clopidogrel), last dose was 1 day                            prior to procedure. ASA Grade Assessment: II - A  patient with mild systemic disease. After reviewing                            the risks and benefits, the patient was deemed in                            satisfactory condition to undergo the procedure.                            After obtaining informed consent, the endoscope was                            passed under direct vision. Throughout the                             procedure, the patient's blood pressure, pulse, and                            oxygen saturations were monitored continuously. The                            [id number] ([id number]) scope was introduced through the                            mouth, and advanced to the second part of duodenum.                            The upper GI endoscopy was somewhat difficult due                            to the patient's agitation. Successful completion                            of the procedure was aided by increasing the dose                            of sedation medication. The patient tolerated the                            procedure fairly well. Scope In: 10:41:22 AM Scope Out: 10:53:59 AM Total Procedure Duration: 0 hours 12 minutes 37 seconds  Findings:      One moderate (circumferential scarring or stenosis; an endoscope may       pass) benign-appearing, intrinsic stenosis was found. And was traversed.       A guidewire was placed and the scope was withdrawn. Dilation was       performed with a Savary dilator with mild resistance at 12.8 mm, 14 mm,       15 mm, 16 mm, 17 mm and 18 mm. Estimated blood loss was minimal.      A few localized small erosions were found in the gastric antrum.       Biopsies were taken with a cold forceps for Helicobacter pylori testing.      The examined duodenum was normal. Impression:               -  Benign-appearing esophageal stenosis. Dilated.                           - Erosive gastropathy. Biopsied. Moderate Sedation:      Moderate (conscious) sedation was administered by the endoscopy nurse       and supervised by the endoscopist. The following parameters were       monitored: oxygen saturation, heart rate, blood pressure, and response       to care. Total physician intraservice time was 53 minutes. Recommendation:           - High fiber diet.                           - Continue present medications.                           - Await pathology  results.                           - Return to my office in 4 months.                           - Patient has a contact number available for                            emergencies. The signs and symptoms of potential                            delayed complications were discussed with the                            patient. Return to normal activities tomorrow.                            Written discharge instructions were provided to the                            patient. Procedure Code(s):        --- Professional ---                           [phone], Esophagogastroduodenoscopy, flexible,                            transoral; with insertion of guide wire followed by                            passage of dilator(s) through esophagus over guide                            wire                           [id number], Esophagogastroduodenoscopy, flexible,                            transoral; with biopsy, single or  multiple                           99152, Moderate sedation services provided by the                            same physician or other qualified health care                            professional performing the diagnostic or                            therapeutic service that the sedation supports,                            requiring the presence of an independent trained                            observer to assist in the monitoring of the                            patient's level of consciousness and physiological                            status; initial 15 minutes of intraservice time,                            patient age 37 years or older                           [phone], Moderate sedation services; each additional                            15 minutes intraservice time                           99153, Moderate sedation services; each additional                            15 minutes intraservice time                           99153, Moderate sedation services; each additional                             15 minutes intraservice time Diagnosis Code(s):        --- Professional ---                           K22.2, Esophageal obstruction                           K31.89, Other diseases of stomach and duodenum                           R13.10, Dysphagia, unspecified  Z87.11, Personal history of peptic ulcer disease CPT copyright 2016 American [employer]. All rights reserved. The codes documented in this report are preliminary and upon coder review may  be revised to meet current compliance requirements. Barney Drain, MD Barney Drain, MD 07/06/2016 11:27:05 AM This report has been signed electronically. Number of Addenda: 0

## 2016-07-06 NOTE — Op Note (Signed)
[facility] Patient Name: Alexis Mckinney Procedure Date: 07/06/2016 9:59 AM MRN: [id number] Date of Birth: 1940-11-09 Attending MD: Barney Drain , MD CSN: [id number] Age: 76 Admit Type: Outpatient Procedure:                Colonoscopy, SCREENING Indications:              High risk colon cancer surveillance: Personal                            history of colonic polyps Providers:                Barney Drain, MD, Janeece Riggers, RN, [employer]. Soldier,                            Merchant [employer] officer Referring MD:             Manual Meier. Comstock, MD Medicines:                Meperidine 75 mg IV, Midazolam 6 mg IV Complications:            No immediate complications. Estimated Blood Loss:     Estimated blood loss: none. Procedure:                Pre-Anesthesia Assessment:                           - Prior to the procedure, a History and Physical                            was performed, and patient medications and                            allergies were reviewed. The patient's tolerance of                            previous anesthesia was also reviewed. The risks                            and benefits of the procedure and the sedation                            options and risks were discussed with the patient.                            All questions were answered, and informed consent                            was obtained. Prior Anticoagulants: The patient has                            taken Plavix (clopidogrel), last dose was 1 day                            prior to procedure. ASA Grade Assessment: II - A  patient with mild systemic disease. After reviewing                            the risks and benefits, the patient was deemed in                            satisfactory condition to undergo the procedure.                            After obtaining informed consent, the colonoscope                            was passed under direct vision. Throughout the                 procedure, the patient's blood pressure, pulse, and                            oxygen saturations were monitored continuously. The                            [id number] ([id number]) scope was introduced through                            the anus and advanced to the the cecum, identified                            by appendiceal orifice and ileocecal valve. The                            colonoscopy was somewhat difficult due to a                            tortuous colon and the patient's agitation.                            Successful completion of the procedure was aided by                            increasing the dose of sedation medication,                            straightening and shortening the scope to obtain                            bowel loop reduction and COLOWRAP. The patient                            tolerated the procedure fairly well. The quality of                            the bowel preparation was good. The ileocecal  valve, appendiceal orifice, and rectum were                            photographed. Scope In: 10:17:39 AM Scope Out: 10:35:48 AM Scope Withdrawal Time: 0 hours 12 minutes 14 seconds  Total Procedure Duration: 0 hours 18 minutes 9 seconds  Findings:      The sigmoid colon and descending colon were moderately redundant.      Multiple small and large-mouthed diverticula were found in the entire       colon.      External hemorrhoids were found during retroflexion. The hemorrhoids       were moderate. Impression:               - Redundant LEFT colon.                           - Diverticulosis in the entire examined colon.                           - External hemorrhoids. Moderate Sedation:      Moderate (conscious) sedation was administered by the endoscopy nurse       and supervised by the endoscopist. The following parameters were       monitored: oxygen saturation, heart rate, blood pressure, and response        to care. Total physician intraservice time was 53 minutes. Recommendation:           - High fiber diet.                           - Continue present medications.                           - Patient has a contact number available for                            emergencies. The signs and symptoms of potential                            delayed complications were discussed with the                            patient. Return to normal activities tomorrow.                            Written discharge instructions were provided to the                            patient.                           - No repeat colonoscopy due to age. Procedure Code(s):        --- Professional ---                           [phone], Colonoscopy, flexible; diagnostic, including  collection of specimen(s) by brushing or washing,                            when performed (separate procedure)                           99152, Moderate sedation services provided by the                            same physician or other qualified health care                            professional performing the diagnostic or                            therapeutic service that the sedation supports,                            requiring the presence of an independent trained                            observer to assist in the monitoring of the                            patient's level of consciousness and physiological                            status; initial 15 minutes of intraservice time,                            patient age 64 years or older                           [phone], Moderate sedation services; each additional                            15 minutes intraservice time                           99153, Moderate sedation services; each additional                            15 minutes intraservice time                           99153, Moderate sedation services; each additional                             15 minutes intraservice time Diagnosis Code(s):        --- Professional ---                           Z86.010, Personal history of colonic polyps                           K64.4,  Residual hemorrhoidal skin tags                           K57.30, Diverticulosis of large intestine without                            perforation or abscess without bleeding                           Q43.8, Other specified congenital malformations of                            intestine CPT copyright 2016 American [employer] Association. All rights reserved. The codes documented in this report are preliminary and upon coder review may  be revised to meet current compliance requirements. Barney Drain, MD Barney Drain, MD 07/06/2016 11:11:05 AM This report has been signed electronically. Number of Addenda: 0

## 2016-07-06 NOTE — Discharge Instructions (Signed)
You have internal hemorrhoids and diverticulosis IN YOUR right and LEFT COLON. I dilated your esophagus DUE TO A STRICTURE. You have EROSIVE gastritis. I biopsied your stomach.   FOLLOW A HIGH FIBER/LOW FAT DIET. AVOID ITEMS THAT CAUSE BLOATING. SEE INFO BELOW.  CONTINUE NEXIUM. TAKE 30 MINUTES BEFORE your first meal.  YOUR BIOPSY RESULTS WILL BE AVAILABLE IN MY CHART MAR 16 AND MY OFFICE WILL CONTACT YOU IN 10-14 DAYS WITH YOUR RESULTS.   FOLLOW UP IN 4 mos.  Next colonoscopy in 10-15 years if the benefits outweigh the risks.  ENDOSCOPY Care After Read the instructions outlined below and refer to this sheet in the next week. These discharge instructions provide you with general information on caring for yourself after you leave the hospital. While your treatment has been planned according to the most current medical practices available, unavoidable complications occasionally occur. If you have any problems or questions after discharge, call DR. Julus Kelley, [phone].  ACTIVITY  You may resume your regular activity, but move at a slower pace for the next 24 hours.   Take frequent rest periods for the next 24 hours.   Walking will help get rid of the air and reduce the bloated feeling in your belly (abdomen).   No driving for 24 hours (because of the medicine (anesthesia) used during the test).   You may shower.   Do not sign any important legal documents or operate any machinery for 24 hours (because of the anesthesia used during the test).    NUTRITION  Drink plenty of fluids.   You may resume your normal diet as instructed by your doctor.   Begin with a light meal and progress to your normal diet. Heavy or fried foods are harder to digest and may make you feel sick to your stomach (nauseated).   Avoid alcoholic beverages for 24 hours or as instructed.    MEDICATIONS  You may resume your normal medications.   WHAT YOU CAN EXPECT TODAY  Some feelings of bloating in  the abdomen.   Passage of more gas than usual.   Spotting of blood in your stool or on the toilet paper  .  IF YOU HAD POLYPS REMOVED DURING THE ENDOSCOPY:  Eat a soft diet IF YOU HAVE NAUSEA, BLOATING, ABDOMINAL PAIN, OR VOMITING.    FINDING OUT THE RESULTS OF YOUR TEST Not all test results are available during your visit. DR. Oneida Alar WILL CALL YOU WITHIN 14 DAYS OF YOUR PROCEDUE WITH YOUR RESULTS. Do not assume everything is normal if you have not heard from DR. Katye Valek, CALL HER OFFICE AT [phone].  SEEK IMMEDIATE MEDICAL ATTENTION AND CALL THE OFFICE: [phone] IF:  You have more than a spotting of blood in your stool.   Your belly is swollen (abdominal distention).   You are nauseated or vomiting.   You have a temperature over 101F.   You have abdominal pain or discomfort that is severe or gets worse throughout the day.  Gastritis  Gastritis is an inflammation (the body's way of reacting to injury and/or infection) of the stomach. It is often caused by viral or bacterial (germ) infections. It can also be caused BY ASPIRIN, BC/GOODY POWDER'S, (IBUPROFEN) MOTRIN, OR ALEVE (NAPROXEN), chemicals (including alcohol), SPICY FOODS, and medications. This illness may be associated with generalized malaise (feeling tired, not well), UPPER ABDOMINAL STOMACH cramps, and fever. One common bacterial cause of gastritis is an organism known as H. Pylori. This can be treated with antibiotics.   High-Fiber  Diet A high-fiber diet changes your normal diet to include more whole grains, legumes, fruits, and vegetables. Changes in the diet involve replacing refined carbohydrates with unrefined foods. The calorie level of the diet is essentially unchanged. The Dietary Reference Intake (recommended amount) for adult males is 38 grams per day. For adult females, it is 25 grams per day. Pregnant and lactating women should consume 28 grams of fiber per day. Fiber is the intact part of a plant that is  not broken down during digestion. Functional fiber is fiber that has been isolated from the plant to provide a beneficial effect in the body. PURPOSE  Increase stool bulk.   Ease and regulate bowel movements.   Lower cholesterol.  REDUCE RISK OF COLON CANCER  INDICATIONS THAT YOU NEED MORE FIBER  Constipation and hemorrhoids.   Uncomplicated diverticulosis (intestine condition) and irritable bowel syndrome.   Weight management.   As a protective measure against hardening of the arteries (atherosclerosis), diabetes, and cancer.   GUIDELINES FOR INCREASING FIBER IN THE DIET  Start adding fiber to the diet slowly. A gradual increase of about 5 more grams (2 slices of whole-wheat bread, 2 servings of most fruits or vegetables, or 1 bowl of high-fiber cereal) per day is best. Too rapid an increase in fiber may result in constipation, flatulence, and bloating.   Drink enough water and fluids to keep your urine clear or pale yellow. Water, juice, or caffeine-free drinks are recommended. Not drinking enough fluid may cause constipation.   Eat a variety of high-fiber foods rather than one type of fiber.   Try to increase your intake of fiber through using high-fiber foods rather than fiber pills or supplements that contain small amounts of fiber.   The goal is to change the types of food eaten. Do not supplement your present diet with high-fiber foods, but replace foods in your present diet.   INCLUDE A VARIETY OF FIBER SOURCES  Replace refined and processed grains with whole grains, canned fruits with fresh fruits, and incorporate other fiber sources. White rice, white breads, and most bakery goods contain little or no fiber.   Brown whole-grain rice, buckwheat oats, and many fruits and vegetables are all good sources of fiber. These include: broccoli, Brussels sprouts, cabbage, cauliflower, beets, sweet potatoes, white potatoes (skin on), carrots, tomatoes, eggplant, squash, berries,  fresh fruits, and dried fruits.   Cereals appear to be the richest source of fiber. Cereal fiber is found in whole grains and bran. Bran is the fiber-rich outer coat of cereal grain, which is largely removed in refining. In whole-grain cereals, the bran remains. In breakfast cereals, the largest amount of fiber is found in those with "bran" in their names. The fiber content is sometimes indicated on the label.   You may need to include additional fruits and vegetables each day.   In baking, for 1 cup white flour, you may use the following substitutions:   1 cup whole-wheat flour minus 2 tablespoons.   1/2 cup white flour plus 1/2 cup whole-wheat flour.   Diverticulosis Diverticulosis is a common condition that develops when small pouches (diverticula) form in the wall of the colon. The risk of diverticulosis increases with age. It happens more often in people who eat a low-fiber diet. Most individuals with diverticulosis have no symptoms. Those individuals with symptoms usually experience belly (abdominal) pain, constipation, or loose stools (diarrhea).  HOME CARE INSTRUCTIONS  Increase the amount of fiber in your diet as directed by your  caregiver or dietician. This may reduce symptoms of diverticulosis.   Drink at least 6 to 8 glasses of water each day to prevent constipation.   Try not to strain when you have a bowel movement.   Avoiding nuts and seeds to prevent complications is NOT NECESSARY.    SEEK IMMEDIATE MEDICAL CARE IF:  You develop increasing pain or severe bloating.   You have an oral temperature above 101F.   You develop vomiting or bowel movements that are bloody or black.

## 2016-07-06 NOTE — H&P (Signed)
Primary Care Physician:  Marval Regal, MD Primary Gastroenterologist:  Dr. Oneida Alar  Pre-Procedure History & Physical: HPI:  Alexis Mckinney is a 76 y.o. female here for  DYSPHAGIA/screening-PERSONAL HISTORY OF POLYPS  Past Medical History:  Diagnosis Date  . Arthritis   . Coronary artery disease   . GERD (gastroesophageal reflux disease)   . High cholesterol   . HTN (hypertension)   . Hypothyroidism   . Ulcer ([place])    EGD 2007, states had bleeding ulcer, secondary to ASA and Plavix    Past Surgical History:  Procedure Laterality Date  . ABDOMINAL HYSTERECTOMY    . cardiac cath with stents     [facility]   . COLONOSCOPY  2010   polyps/[facility]  . COLONOSCOPY  01/01/2012   Dr. Oneida Alar: diverticulosis, sessile polyps (hyperplastic) due for surveillance in 2018 due to history of multiple colonic polyps   . ESOPHAGEAL DILATION N/A 11/11/2015   Procedure: ESOPHAGEAL DILATION;  Surgeon: Danie Binder, MD;  [facility];  Service: Endoscopy;  Laterality: N/A;  . ESOPHAGOGASTRODUODENOSCOPY  2007  . ESOPHAGOGASTRODUODENOSCOPY N/A 09/30/2015   Dr. Oneida Alar: esophageal stenosis s/p dilation, oozing gastric ulcer with cautery and injected, multiple non-bleeding duodenal ulcers, negative H.pylori   . ESOPHAGOGASTRODUODENOSCOPY N/A 11/11/2015   Dr. Oneida Alar: dysphagia due to esophageal stricture s/p dilation, non-bleeding gastric ulcer, moderate gastritis, mild duodenitis, needs 3 month surveillance  . EYE SURGERY     lens implant left eye  . SAVORY DILATION N/A 09/30/2015   Procedure: SAVORY DILATION;  Surgeon: Danie Binder, MD;  Location: [facility];  Service: Endoscopy;  Laterality: N/A;  . TONSILLECTOMY      Prior to Admission medications   Medication Sig Start Date End Date Taking? Authorizing Provider  amLODipine-benazepril (LOTREL) 5-20 MG per capsule Take 1 capsule by mouth daily.   Yes Historical Provider, MD  atorvastatin (LIPITOR) 80 MG tablet Take 80 mg by mouth  daily.   Yes Historical Provider, MD  clopidogrel (PLAVIX) 75 MG tablet Take 75 mg by mouth daily.   Yes Historical Provider, MD  esomeprazole (NEXIUM) 40 MG capsule Take 40 mg by mouth daily at 12 noon.   Yes Historical Provider, MD  levothyroxine (SYNTHROID, LEVOTHROID) 75 MCG tablet Take 100 mcg by mouth daily.    Yes Historical Provider, MD  metoprolol succinate (TOPROL-XL) 50 MG 24 hr tablet Take 50 mg by mouth daily. Take with or immediately following a meal.   Yes Historical Provider, MD  peg 3350 powder (MOVIPREP) 100 g SOLR Take 1 kit (200 g total) by mouth as directed. 06/18/16  Yes Danie Binder, MD  ranitidine (ZANTAC) 150 MG tablet Take 150 mg by mouth 2 (two) times daily.   Yes Historical Provider, MD  polyethylene glycol-electrolytes (TRILYTE) 420 g solution Take 4,000 mLs by mouth as directed. 06/17/16   Danie Binder, MD    Allergies as of 06/17/2016 - Review Complete 06/17/2016  Allergen Reaction Noted  . Other Anaphylaxis 11/11/2015  . Watermelon concentrate [citrullus vulgaris] Hypertension 01/01/2012    Family History  Problem Relation Age of Onset  . Prostate cancer Brother   . Heart attack Father   . Aneurysm Sister   . Aneurysm Brother   . Lung disease Brother   . Colon cancer Neg Hx     Social History   Social History  . Marital status: Single    Spouse name: N/A  . Number of children: N/A  . Years of  education: N/A   Occupational History  . Not on file.   Social History Main Topics  . Smoking status: Former Smoker    Packs/day: 1.00    Types: Cigarettes  . Smokeless tobacco: Former Systems developer    Quit date: 04/28/1978  . Alcohol use No  . Drug use: No  . Sexual activity: Not on file   Other Topics Concern  . Not on file   Social History Narrative  . No narrative on file    Review of Systems: See HPI, otherwise negative ROS   Physical Exam: BP (!) 152/80   Pulse 65   Temp 98.2 F (36.8 C) (Oral)   Resp 18   Ht 5' 7"  (1.702 m)   Wt 203  lb (92.1 kg)   SpO2 100%   BMI 31.79 kg/m  General:   Alert,  pleasant and cooperative in NAD Head:  Normocephalic and atraumatic. Neck:  Supple; Lungs:  Clear throughout to auscultation.    Heart:  Regular rate and rhythm. Abdomen:  Soft, nontender and nondistended. Normal bowel sounds, without guarding, and without rebound.   Neurologic:  Alert and  oriented x4;  grossly normal neurologically.  Impression/Plan:     DYSPHAGIA/screening-PERSONAL HISTORY OF POLYPS  PLAN:  EGD/DIL/tcs TODAY. DISCUSSED PROCEDURE, BENEFITS, & RISKS: < 1% chance of medication reaction, bleeding, perforation, or rupture of spleen/liver.

## 2016-07-08 ENCOUNTER — Encounter (HOSPITAL_COMMUNITY): Payer: Self-pay | Admitting: Gastroenterology

## 2016-07-29 ENCOUNTER — Telehealth: Payer: Self-pay | Admitting: Gastroenterology

## 2016-07-29 NOTE — Telephone Encounter (Signed)
Please call pt. HER stomach Bx shows gastritis.    FOLLOW A HIGH FIBER/LOW FAT DIET. AVOID ITEMS THAT CAUSE BLOATING.   CONTINUE NEXIUM. TAKE 30 MINUTES BEFORE your first meal.  FOLLOW UP IN 4 mos E30 DYSPHAGIA/PUD.  Next colonoscopy in 10-15 years if the benefits outweigh the risks.

## 2016-11-05 ENCOUNTER — Ambulatory Visit (INDEPENDENT_AMBULATORY_CARE_PROVIDER_SITE_OTHER): Payer: Medicare Other | Admitting: Gastroenterology

## 2016-11-05 ENCOUNTER — Encounter: Payer: Self-pay | Admitting: Gastroenterology

## 2016-11-05 VITALS — BP 157/80 | HR 55 | Temp 98.5°F | Ht 67.0 in | Wt 198.6 lb

## 2016-11-05 DIAGNOSIS — K5900 Constipation, unspecified: Secondary | ICD-10-CM | POA: Diagnosis not present

## 2016-11-05 NOTE — Patient Instructions (Signed)
I have given you samples of Linzess. Take 1 capsule each morning on an empty stomach, 30 minutes before breakfast. This could cause some loose stool for a the first few days, but it should get better. If not, call me.   I am going to see you in 3 months to follow-up on constipation, abdominal discomfort.

## 2016-11-05 NOTE — Progress Notes (Signed)
Referring Provider: Marval Regal, MD Primary Care Physician:  Marval Regal, MD Primary GI: Dr. Oneida Alar   Chief Complaint  Patient presents with  . Abdominal Pain    right side    HPI:   Alexis Mckinney is a 76 y.o. female presenting today with a history of dysphagia secondary to strictures, PUD, with recent EGD for surveillance noting benign-appearing esophageal stenosis s/p dilation and erosive gastropathy. Colonoscopy completed with pancolonic diverticula. Has taken Aleve historically.   States remembers the colonoscopy. Hurting in right lower back and radiating to RLQ. States it is improving but has noticed it for the past few months. 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   Notes lower abdominal discomfort at times. Takes MOM for constipation, which she likes. If she doesn't take MOM, doesn't go at all. Takes once or twice a week. Has a BM only once or twice a week. Had taken Amitiza before but didn't help per patient.   Dysphagia resolved. Nexium once daily.   Past Medical History:  Diagnosis Date  . Arthritis   . Coronary artery disease   . GERD (gastroesophageal reflux disease)   . High cholesterol   . HTN (hypertension)   . Hypothyroidism   . Ulcer    EGD 2007, states had bleeding ulcer, secondary to ASA and Plavix    Past Surgical History:  Procedure Laterality Date  . ABDOMINAL HYSTERECTOMY    . cardiac cath with stents     [facility]   . COLONOSCOPY  2010   polyps/[facility]  . COLONOSCOPY  01/01/2012   Dr. Oneida Alar: diverticulosis, sessile polyps (hyperplastic) due for surveillance in 2018 due to history of multiple colonic polyps   . COLONOSCOPY N/A 07/06/2016   Dr. Oneida Alar: redundant left colon, pancolonic diverticula  . ESOPHAGEAL DILATION N/A 11/11/2015   Procedure: ESOPHAGEAL DILATION;  Surgeon: Danie Binder, MD;  Location: [facility];  Service: Endoscopy;  Laterality: N/A;  . ESOPHAGOGASTRODUODENOSCOPY  2007  .  ESOPHAGOGASTRODUODENOSCOPY N/A 09/30/2015   Dr. Oneida Alar: esophageal stenosis s/p dilation, oozing gastric ulcer with cautery and injected, multiple non-bleeding duodenal ulcers, negative H.pylori   . ESOPHAGOGASTRODUODENOSCOPY N/A 11/11/2015   Dr. Oneida Alar: dysphagia due to esophageal stricture s/p dilation, non-bleeding gastric ulcer, moderate gastritis, mild duodenitis, needs 3 month surveillance  . ESOPHAGOGASTRODUODENOSCOPY N/A 07/06/2016   Dr. Oneida Alar: benign-appearing esophageal stenosis s/p dilation, erosive gastropathy, negative H.pylori  . EYE SURGERY     lens implant left eye  . SAVORY DILATION N/A 09/30/2015   Procedure: SAVORY DILATION;  Surgeon: Danie Binder, MD;  [facility];  Service: Endoscopy;  Laterality: N/A;  . SAVORY DILATION N/A 07/06/2016   Procedure: SAVORY DILATION;  Surgeon: Danie Binder, MD;  Location: [facility];  Service: Endoscopy;  Laterality: N/A;  . TONSILLECTOMY      Current Outpatient Prescriptions  Medication Sig Dispense Refill  . amLODipine-benazepril (LOTREL) 5-20 MG per capsule Take 1 capsule by mouth daily.    Marland Kitchen atorvastatin (LIPITOR) 80 MG tablet Take 80 mg by mouth daily.    . clopidogrel (PLAVIX) 75 MG tablet Take 75 mg by mouth daily.    Marland Kitchen esomeprazole (NEXIUM) 40 MG capsule Take 40 mg by mouth daily at 12 noon.    Marland Kitchen levothyroxine (SYNTHROID, LEVOTHROID) 75 MCG tablet Take 100 mcg by mouth daily.     . metoprolol succinate (TOPROL-XL) 50 MG 24 hr tablet Take 50 mg by mouth daily. Take with or  immediately following a meal.    . ranitidine (ZANTAC) 150 MG tablet Take 150 mg by mouth 2 (two) times daily.     No current facility-administered medications for this visit.     Allergies as of 11/05/2016 - Review Complete 11/05/2016  Allergen Reaction Noted  . Other Anaphylaxis 11/11/2015  . Watermelon concentrate [citrullus vulgaris] Hypertension 01/01/2012    Family History  Problem Relation Age of Onset  . Prostate cancer Brother   .  Heart attack Father   . Aneurysm Sister   . Aneurysm Brother   . Lung disease Brother   . Colon cancer Neg Hx     Social History   Social History  . Marital status: Single    Spouse name: N/A  . Number of children: N/A  . Years of education: N/A   Social History Main Topics  . Smoking status: Former Smoker    Packs/day: 1.00    Types: Cigarettes  . Smokeless tobacco: Former Systems developer    Quit date: 04/28/1978  . Alcohol use No  . Drug use: No  . Sexual activity: Not Asked   Other Topics Concern  . None   Social History Narrative  . None    Review of Systems: As mentioned in HPI.   Physical Exam: BP (!) 157/80   Pulse (!) 55   Temp 98.5 F (36.9 C) (Oral)   Ht 5\' 7"  (1.702 m)   Wt 198 lb 9.6 oz (90.1 kg)   BMI 31.11 kg/m  General:   Alert and oriented. No distress noted. Pleasant and cooperative.  Head:  Normocephalic and atraumatic. Eyes:  Conjuctiva clear without scleral icterus. Mouth:  Oral mucosa pink and moist. Good dentition. No lesions. Abdomen:  +BS, soft, non-tender and non-distended. No rebound or guarding. No HSM or masses noted. Msk:  Symmetrical without gross deformities. Normal posture. Extremities:  Without edema. Neurologic:  Alert and  oriented x4;  grossly normal neurologically. Psych:  Alert and cooperative. Normal mood and affect.

## 2016-11-05 NOTE — Progress Notes (Signed)
cc'ed to pcp °

## 2016-11-05 NOTE — Assessment & Plan Note (Addendum)
76 year old female with chronic constipation and likely as the culprit for intermittent vague lower abdominal discomfort. Recent colonoscopy on file. No alarm symptoms. Not ideally managed with MOM several times a week, as she only has a BM about 1-2 times per week. From her description, appears Amitiza was not helpful in the past. Will trial Linzess 145 mcg once daily. Samples provided. Return in 3 months.

## 2016-11-29 NOTE — Progress Notes (Signed)
REVIEWED-NO ADDITIONAL RECOMMENDATIONS. 

## 2017-02-05 ENCOUNTER — Ambulatory Visit: Payer: Medicare Other | Admitting: Gastroenterology

## 2017-06-16 IMAGING — MR MR LUMBAR SPINE W/O CM
4 of 5 series · 15 of 48 positions shown · non-contrast
Comparison: None.

CLINICAL DATA: Low back pain radiating into both legs for 2 years.
No known injury.

EXAM:
MRI LUMBAR SPINE WITHOUT CONTRAST
TECHNIQUE: Multiplanar, multisequence MR imaging of the lumbar spine was
performed. No intravenous contrast was administered.

[Series 3: T2 · sagittal · 4.0mm · 0.73mm/px · 6 of 15 slices shown (1 of 2)]
[im 1/15]
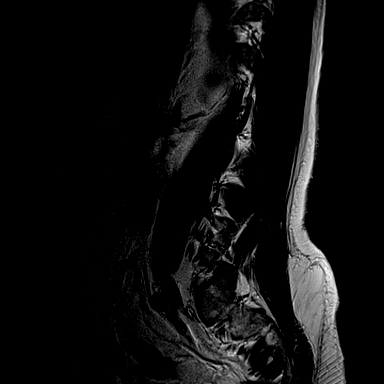
[im 3/15]
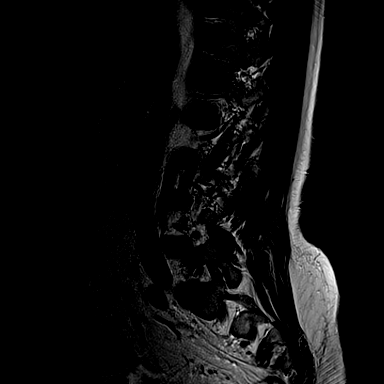
[im 6/15]
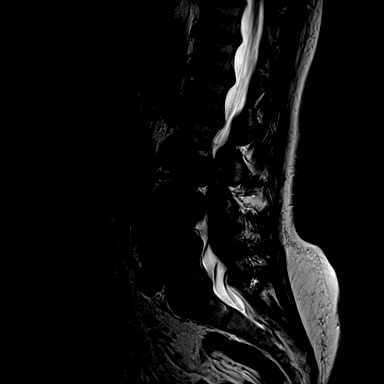
[im 9/15]
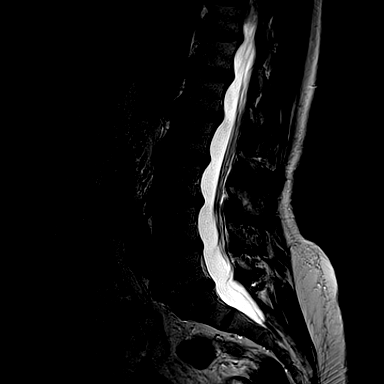
[im 12/15]
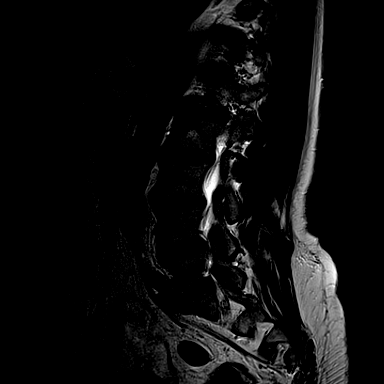
[im 15/15]
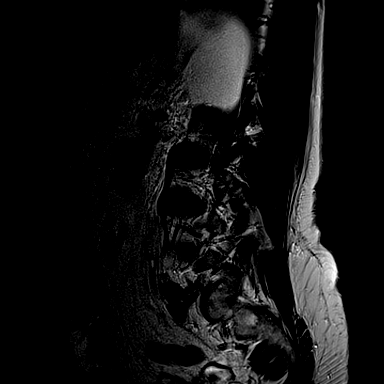

[Series 4: T1 · sagittal · 4.0mm · 0.40mm/px · 3 of 15 slices shown (1 of 2)]
[im 3/15]
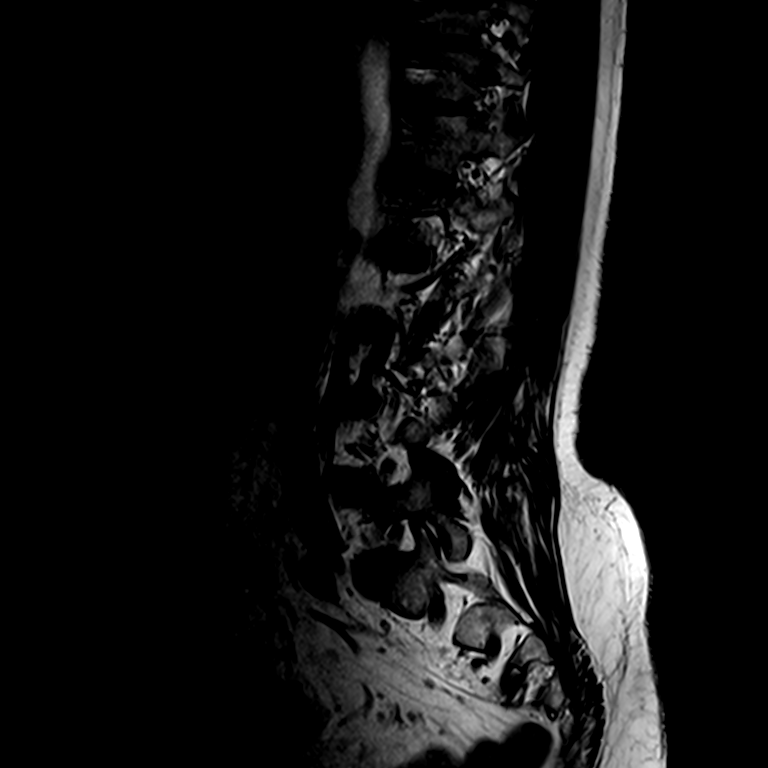
[im 9/15]
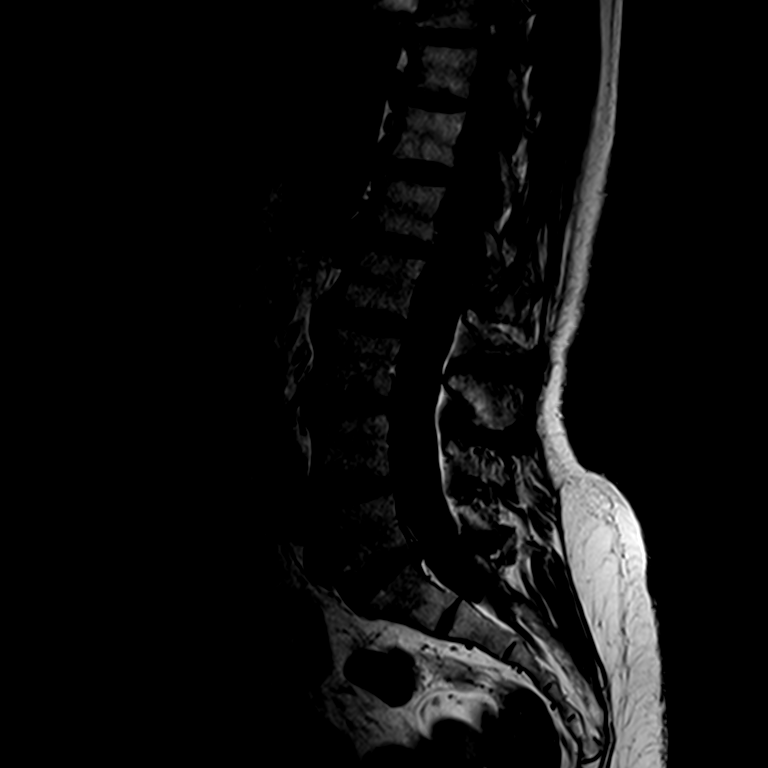
[im 15/15]
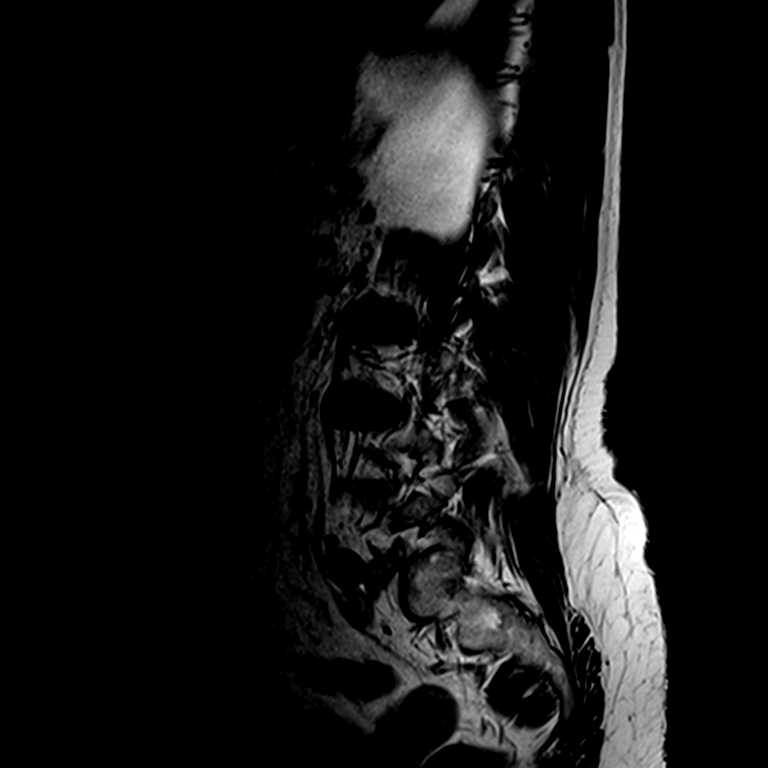

[Series 6: T2 · axial · 4.0mm · 0.21mm/px · z∈[-48,+81]mm · 3 of 35 slices shown (2 of 2)]
[im 5/35]
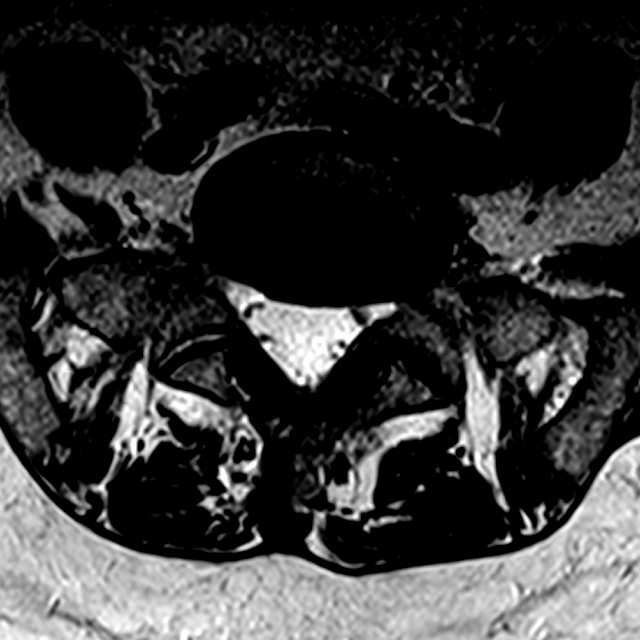
[im 18/35]
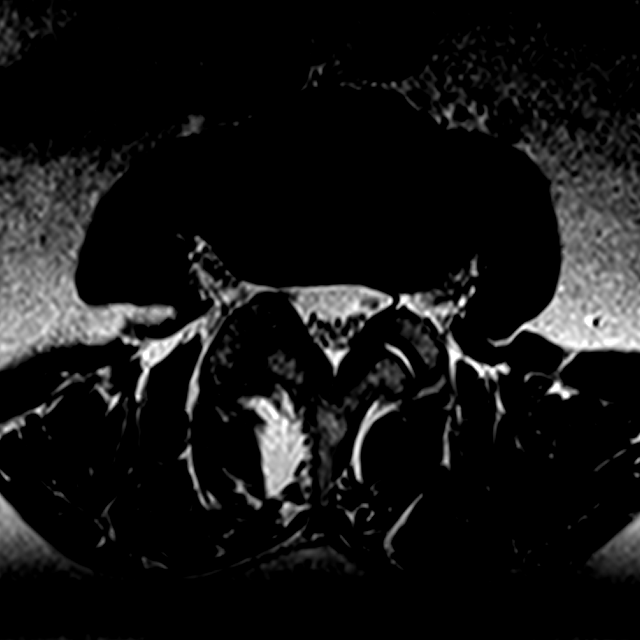
[im 30/35]
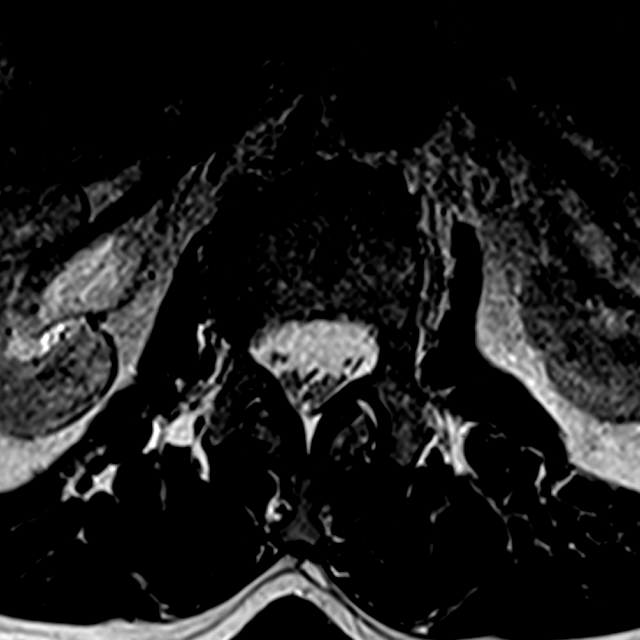

[Series 7: T1 · axial · 4.0mm · 0.21mm/px · z∈[-48,+81]mm · 3 of 35 slices shown (2 of 2)]
[im 5/35]
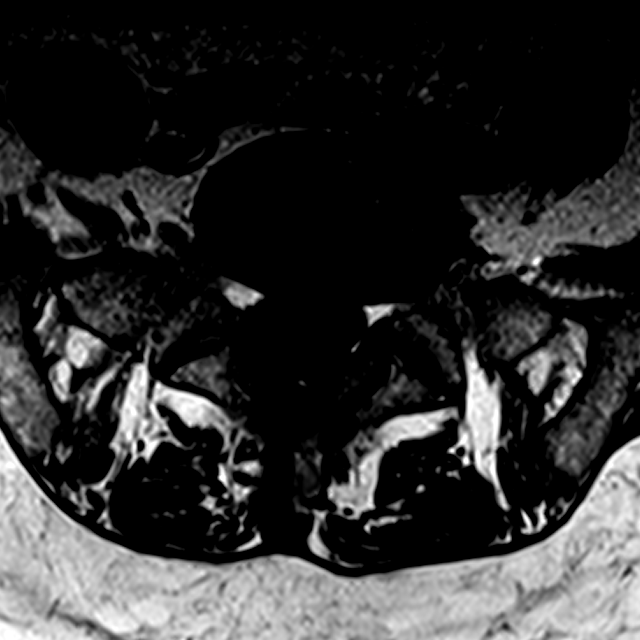
[im 18/35]
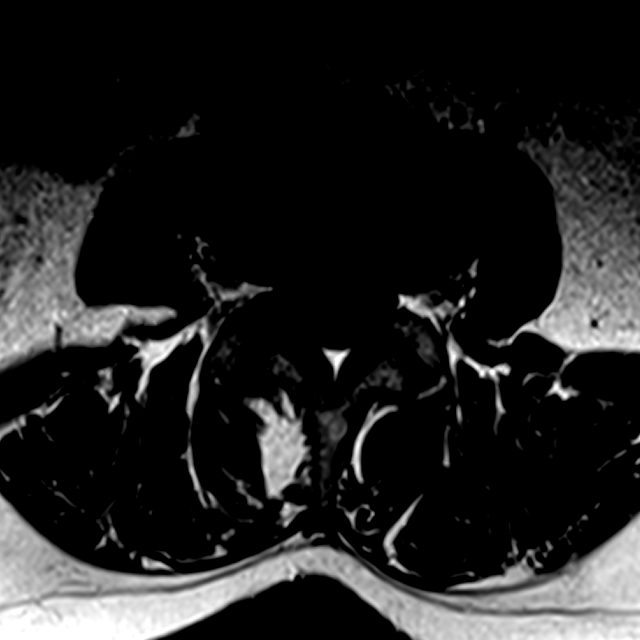
[im 30/35]
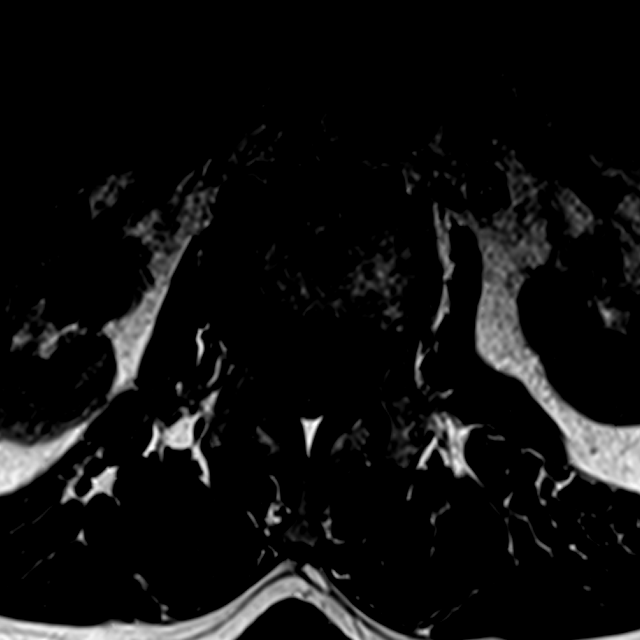

[15 of 48 positions shown; findings below may reference images not displayed]

FINDINGS: Segmentation:  Unremarkable.

Alignment:  Convex left scoliosis is identified.  No listhesis.

Vertebrae:  Height is maintained.  Small hemangioma in L2 is noted.

Conus medullaris: Extends to the L1 level and appears normal.

Paraspinal and other soft tissues: Unremarkable.

Disc levels:

T11-12 and T12-L1 are imaged in the sagittal plane only. Disc
bulging is seen at both levels but the central canal and foramina
appear open.

L1-2: Shallow disc bulge without central canal or foraminal
stenosis.

L2-3: Shallow disc bulge without central canal or foraminal
stenosis.

L3-4: Shallow disc bulge and mild facet degenerative change on the
right. Mild right foraminal narrowing is seen. The central canal and
left foramen are open. No nerve root compression.

L4-5: Shallow disc bulge more prominent to the left without central
canal or foraminal stenosis.

L5-S1: Mild facet degenerative change and a shallow disc bulge
without central canal or right foraminal stenosis. Mild left
foraminal narrowing is noted. No nerve root compression.
IMPRESSION: Mild multilevel disc bulging without central canal stenosis. Mild
right foraminal narrowing at L3-4 and left foraminal narrowing at
L5-S1 without nerve root compression is noted. The foramina are
otherwise widely patent.
